# Patient Record
Sex: Male | Born: 1950 | Race: White | Hispanic: No | Marital: Married | Smoking: Current every day smoker
Health system: Southern US, Community
[De-identification: ages and names within clinical notes are randomized; demographics above are authoritative.]

## PROBLEM LIST (undated history)

## (undated) DIAGNOSIS — I10 Essential (primary) hypertension: Secondary | ICD-10-CM

## (undated) DIAGNOSIS — B019 Varicella without complication: Secondary | ICD-10-CM

## (undated) DIAGNOSIS — E7800 Pure hypercholesterolemia, unspecified: Secondary | ICD-10-CM

## (undated) DIAGNOSIS — K219 Gastro-esophageal reflux disease without esophagitis: Secondary | ICD-10-CM

## (undated) DIAGNOSIS — K5900 Constipation, unspecified: Secondary | ICD-10-CM

## (undated) HISTORY — DX: Gastro-esophageal reflux disease without esophagitis: K21.9

## (undated) HISTORY — PX: COLONOSCOPY WITH PROPOFOL: SHX5780

## (undated) HISTORY — PX: OTHER SURGICAL HISTORY: SHX169

## (undated) HISTORY — DX: Pure hypercholesterolemia, unspecified: E78.00

## (undated) HISTORY — DX: Varicella without complication: B01.9

## (undated) HISTORY — DX: Essential (primary) hypertension: I10

---

## 2011-04-08 ENCOUNTER — Encounter: Payer: Self-pay | Admitting: *Deleted

## 2011-04-08 ENCOUNTER — Ambulatory Visit (INDEPENDENT_AMBULATORY_CARE_PROVIDER_SITE_OTHER): Payer: BC Managed Care – PPO | Admitting: Cardiovascular Disease

## 2011-04-08 DIAGNOSIS — R079 Chest pain, unspecified: Secondary | ICD-10-CM | POA: Insufficient documentation

## 2011-04-08 DIAGNOSIS — E785 Hyperlipidemia, unspecified: Secondary | ICD-10-CM

## 2011-04-08 DIAGNOSIS — E1169 Type 2 diabetes mellitus with other specified complication: Secondary | ICD-10-CM | POA: Insufficient documentation

## 2011-04-08 DIAGNOSIS — E663 Overweight: Secondary | ICD-10-CM | POA: Insufficient documentation

## 2011-04-08 DIAGNOSIS — E119 Type 2 diabetes mellitus without complications: Secondary | ICD-10-CM

## 2011-04-08 DIAGNOSIS — E669 Obesity, unspecified: Secondary | ICD-10-CM

## 2011-04-08 DIAGNOSIS — I10 Essential (primary) hypertension: Secondary | ICD-10-CM

## 2011-04-08 NOTE — Assessment & Plan Note (Signed)
He does have some joint ache. We have suggested if symptoms get worse that he talk with Dr. Juanetta Gosling about an alternate cholesterol medication.

## 2011-04-08 NOTE — Assessment & Plan Note (Signed)
We have encouraged continued exercise, careful diet management in an effort to lose weight. 

## 2011-04-08 NOTE — Patient Instructions (Signed)
You are doing well. No medication changes were made. Monitor your heart rate at home  Please call us if you have new issues that need to be addressed before your next appt.

## 2011-04-08 NOTE — Progress Notes (Signed)
[name], male    DOB: [birth year], 61 y.o.   MRN: [id number]  HPI Comments: Jonathan Faulkner is a very pleasant 61 year old gentleman, patient of Dr. Juanetta Gosling, history of diabetes, hypertension, hyperlipidemia, obesity who presents with recent episodes of chest discomfort.  He reports that his symptoms are exacerbated by food such as spicy beans. He was started on omeprazole one month ago and his symptoms have significantly improved. He is able to belch frequently and his symptoms are relieved. He has been very active, managing a gun range. This morning he was sleeping for 30 minutes, moving wood he did not have any symptoms of chest pain. In the office, he had some tightness but was able to belch and relieve the discomfort. He reports he is much better than one month ago prior to the stomach medication. He was instructed recently to take omeprazole twice a day though he has not done this yet.   He is very active at baseline and does not feel he has any problems with his heart.  He gets up very early in the morning to do his chores and sometimes works until 6 PM. He denies any lightheadedness or dizziness or significant fatigue.  EKG shows sinus bradycardia rate 42 beats per minute with no significant ST or T wave changes   Outpatient Encounter Prescriptions as of 04/08/2011  Medication Sig Dispense Refill  . atenolol-chlorthalidone (TENORETIC) 50-25 MG per tablet Take 1 tablet by mouth daily.      Marland Kitchen atorvastatin (LIPITOR) 10 MG tablet Take 10 mg by mouth daily.      . metFORMIN (GLUCOPHAGE) 500 MG tablet Take 500 mg by mouth 2 (two) times daily with a meal.       . omeprazole (PRILOSEC) 20 MG capsule Take 20 mg by mouth daily.        Review of Systems  Constitutional: Negative.   HENT: Negative.   Eyes: Negative.   Respiratory: Negative.   Cardiovascular: Positive for chest pain.  Gastrointestinal: Negative.   Musculoskeletal: Negative.   Skin: Negative.   Neurological:  Negative.   Hematological: Negative.   Psychiatric/Behavioral: Negative.   All other systems reviewed and are negative.    BP 124/79  Pulse 42  Ht 6' (1.829 m)  Wt 236 lb 6.4 oz (107.23 kg)  BMI 32.06 kg/m2  Physical Exam  Nursing note and vitals reviewed. Constitutional: He is oriented to person, place, and time. He appears well-developed and well-nourished.       Obese  HENT:  Head: Normocephalic.  Nose: Nose normal.  Mouth/Throat: Oropharynx is clear and moist.  Eyes: Conjunctivae are normal. Pupils are equal, round, and reactive to light.  Neck: Normal range of motion. Neck supple. No JVD present.  Cardiovascular: Normal rate, regular rhythm, S1 normal, S2 normal, normal heart sounds and intact distal pulses.  Exam reveals no gallop and no friction rub.   No murmur heard. Pulmonary/Chest: Effort normal and breath sounds normal. No respiratory distress. He has no wheezes. He has no rales. He exhibits no tenderness.  Abdominal: Soft. Bowel sounds are normal. He exhibits no distension. There is no tenderness.  Musculoskeletal: Normal range of motion. He exhibits no edema and no tenderness.  Lymphadenopathy:    He has no cervical adenopathy.  Neurological: He is alert and oriented to person, place, and time. Coordination normal.  Skin: Skin is warm and dry. No rash noted. No erythema.  Psychiatric: He has a normal mood and affect. His behavior is  normal. Judgment and thought content normal.           Assessment and Plan

## 2011-04-08 NOTE — Assessment & Plan Note (Signed)
Blood pressure is well controlled on today's visit. No changes made to the medications. 

## 2011-04-08 NOTE — Assessment & Plan Note (Signed)
Atypical type chest pain symptoms, improved with proton pump inhibitor and belching. I would agree with taking omeprazole twice a day as was suggested to him by Dr. Juanetta Gosling. He is very active at baseline with no symptoms. I suggested we monitor him for now. If he begins to have symptoms with exertion, we would recommend routine treadmill testing.

## 2013-03-18 ENCOUNTER — Ambulatory Visit (INDEPENDENT_AMBULATORY_CARE_PROVIDER_SITE_OTHER): Payer: BC Managed Care – PPO

## 2013-03-18 ENCOUNTER — Ambulatory Visit (INDEPENDENT_AMBULATORY_CARE_PROVIDER_SITE_OTHER): Payer: BC Managed Care – PPO | Admitting: Podiatry

## 2013-03-18 ENCOUNTER — Encounter: Payer: Self-pay | Admitting: Podiatry

## 2013-03-18 VITALS — Ht 72.0 in | Wt 237.0 lb

## 2013-03-18 DIAGNOSIS — M722 Plantar fascial fibromatosis: Secondary | ICD-10-CM

## 2013-03-18 NOTE — Progress Notes (Signed)
   Subjective:    [name], male    DOB: 09-22-1950, 63 y.o.   MRN: [id number]  HPI Comments: My right heel , plantar heel right, feels like a nail at the center of the heel , it has been acting up for about 4-5 months now.   Foot Pain      Review of Systems  All other systems reviewed and are negative.       Objective:   Physical Exam: I have reviewed his past medical history medications allergies surgeries social history and review of systems. Pulses are strongly palpable bilateral neurologic sensorium is intact per since once the monofilament. Capillary fill time to digits one through 5 is immediate. Deep tendon reflexes are brisk and equal bilateral. Muscle strength is 5 over 5 dorsiflexors plantar flexors inverters everters all intrinsic musculature is intact. Orthopedic evaluation demonstrates rectus foot type bilateral. All joints distal to the ankle have a full range of motion without crepitation. Pain on palpation of the medial calcaneal tubercle of the right heel. No pain on medial and lateral compression of the calcaneus. Cutaneous evaluation demonstrates supple well hydrated cutis.        Assessment & Plan:  Assessment: Plantar fasciitis right  Plan: Discussed appropriate shoe gear stretching exercises ice therapy shoe gear modifications. He is currently wearing a pair diabetic shoes from [employer]. Because of his elevated blood sugars I will not give him a steroid by mouth but I did inject his right heel with Kenalog and local anesthetic I put him in a plantar fascial brace and a night splint. And I will followup with him in one month

## 2013-03-18 NOTE — Patient Instructions (Signed)
Plantar Fasciitis (Heel Spur Syndrome) with Rehab The plantar fascia is a fibrous, ligament-like, soft-tissue structure that spans the bottom of the foot. Plantar fasciitis is a condition that causes pain in the foot due to inflammation of the tissue. SYMPTOMS   Pain and tenderness on the underneath side of the foot.  Pain that worsens with standing or walking. CAUSES  Plantar fasciitis is caused by irritation and injury to the plantar fascia on the underneath side of the foot. Common mechanisms of injury include:  Direct trauma to bottom of the foot.  Damage to a small nerve that runs under the foot where the main fascia attaches to the heel bone.  Stress placed on the plantar fascia due to bone spurs. RISK INCREASES WITH:   Activities that place stress on the plantar fascia (running, jumping, pivoting, or cutting).  Poor strength and flexibility.  Improperly fitted shoes.  Tight calf muscles.  Flat feet.  Failure to warm-up properly before activity.  Obesity. PREVENTION  Warm up and stretch properly before activity.  Allow for adequate recovery between workouts.  Maintain physical fitness:  Strength, flexibility, and endurance.  Cardiovascular fitness.  Maintain a health body weight.  Avoid stress on the plantar fascia.  Wear properly fitted shoes, including arch supports for individuals who have flat feet. PROGNOSIS  If treated properly, then the symptoms of plantar fasciitis usually resolve without surgery. However, occasionally surgery is necessary. RELATED COMPLICATIONS   Recurrent symptoms that may result in a chronic condition.  Problems of the lower back that are caused by compensating for the injury, such as limping.  Pain or weakness of the foot during push-off following surgery.  Chronic inflammation, scarring, and partial or complete fascia tear, occurring more often from repeated injections. TREATMENT  Treatment initially involves the use of  ice and medication to help reduce pain and inflammation. The use of strengthening and stretching exercises may help reduce pain with activity, especially stretches of the Achilles tendon. These exercises may be performed at home or with a therapist. Your caregiver may recommend that you use heel cups of arch supports to help reduce stress on the plantar fascia. Occasionally, corticosteroid injections are given to reduce inflammation. If symptoms persist for greater than 6 months despite non-surgical (conservative), then surgery may be recommended.  MEDICATION   If pain medication is necessary, then nonsteroidal anti-inflammatory medications, such as aspirin and ibuprofen, or other minor pain relievers, such as acetaminophen, are often recommended.  Do not take pain medication within 7 days before surgery.  Prescription pain relievers may be given if deemed necessary by your caregiver. Use only as directed and only as much as you need.  Corticosteroid injections may be given by your caregiver. These injections should be reserved for the most serious cases, because they may only be given a certain number of times. HEAT AND COLD  Cold treatment (icing) relieves pain and reduces inflammation. Cold treatment should be applied for 10 to 15 minutes every 2 to 3 hours for inflammation and pain and immediately after any activity that aggravates your symptoms. Use ice packs or massage the area with a piece of ice (ice massage).  Heat treatment may be used prior to performing the stretching and strengthening activities prescribed by your caregiver, physical therapist, or athletic trainer. Use a heat pack or soak the injury in warm water. SEEK IMMEDIATE MEDICAL CARE IF:  Treatment seems to offer no benefit, or the condition worsens.  Any medications produce adverse side effects. EXERCISES RANGE   OF MOTION (ROM) AND STRETCHING EXERCISES - Plantar Fasciitis (Heel Spur Syndrome) These exercises may help you  when beginning to rehabilitate your injury. Your symptoms may resolve with or without further involvement from your physician, physical therapist or athletic trainer. While completing these exercises, remember:   Restoring tissue flexibility helps normal motion to return to the joints. This allows healthier, less painful movement and activity.  An effective stretch should be held for at least 30 seconds.  A stretch should never be painful. You should only feel a gentle lengthening or release in the stretched tissue. RANGE OF MOTION - Toe Extension, Flexion  Sit with your right / left leg crossed over your opposite knee.  Grasp your toes and gently pull them back toward the top of your foot. You should feel a stretch on the bottom of your toes and/or foot.  Hold this stretch for __________ seconds.  Now, gently pull your toes toward the bottom of your foot. You should feel a stretch on the top of your toes and or foot.  Hold this stretch for __________ seconds. Repeat __________ times. Complete this stretch __________ times per day.  RANGE OF MOTION - Ankle Dorsiflexion, Active Assisted  Remove shoes and sit on a chair that is preferably not on a carpeted surface.  Place right / left foot under knee. Extend your opposite leg for support.  Keeping your heel down, slide your right / left foot back toward the chair until you feel a stretch at your ankle or calf. If you do not feel a stretch, slide your bottom forward to the edge of the chair, while still keeping your heel down.  Hold this stretch for __________ seconds. Repeat __________ times. Complete this stretch __________ times per day.  STRETCH  Gastroc, Standing  Place hands on wall.  Extend right / left leg, keeping the front knee somewhat bent.  Slightly point your toes inward on your back foot.  Keeping your right / left heel on the floor and your knee straight, shift your weight toward the wall, not allowing your back to  arch.  You should feel a gentle stretch in the right / left calf. Hold this position for __________ seconds. Repeat __________ times. Complete this stretch __________ times per day. STRETCH  Soleus, Standing  Place hands on wall.  Extend right / left leg, keeping the other knee somewhat bent.  Slightly point your toes inward on your back foot.  Keep your right / left heel on the floor, bend your back knee, and slightly shift your weight over the back leg so that you feel a gentle stretch deep in your back calf.  Hold this position for __________ seconds. Repeat __________ times. Complete this stretch __________ times per day. STRETCH  Gastrocsoleus, Standing  Note: This exercise can place a lot of stress on your foot and ankle. Please complete this exercise only if specifically instructed by your caregiver.   Place the ball of your right / left foot on a step, keeping your other foot firmly on the same step.  Hold on to the wall or a rail for balance.  Slowly lift your other foot, allowing your body weight to press your heel down over the edge of the step.  You should feel a stretch in your right / left calf.  Hold this position for __________ seconds.  Repeat this exercise with a slight bend in your right / left knee. Repeat __________ times. Complete this stretch __________ times per day.    STRENGTHENING EXERCISES - Plantar Fasciitis (Heel Spur Syndrome)  These exercises may help you when beginning to rehabilitate your injury. They may resolve your symptoms with or without further involvement from your physician, physical therapist or athletic trainer. While completing these exercises, remember:   Muscles can gain both the endurance and the strength needed for everyday activities through controlled exercises.  Complete these exercises as instructed by your physician, physical therapist or athletic trainer. Progress the resistance and repetitions only as guided. STRENGTH - Towel  Curls  Sit in a chair positioned on a non-carpeted surface.  Place your foot on a towel, keeping your heel on the floor.  Pull the towel toward your heel by only curling your toes. Keep your heel on the floor.  If instructed by your physician, physical therapist or athletic trainer, add ____________________ at the end of the towel. Repeat __________ times. Complete this exercise __________ times per day. STRENGTH - Ankle Inversion  Secure one end of a rubber exercise band/tubing to a fixed object (table, pole). Loop the other end around your foot just before your toes.  Place your fists between your knees. This will focus your strengthening at your ankle.  Slowly, pull your big toe up and in, making sure the band/tubing is positioned to resist the entire motion.  Hold this position for __________ seconds.  Have your muscles resist the band/tubing as it slowly pulls your foot back to the starting position. Repeat __________ times. Complete this exercises __________ times per day.  Document Released: 01/21/2005 Document Revised: 04/15/2011 Document Reviewed: 05/05/2008 [employer] Patient Information 2014 [employer]. Plantar Fasciitis Plantar fasciitis is a common condition that causes foot pain. It is soreness (inflammation) of the band of tough fibrous tissue on the bottom of the foot that runs from the heel bone (calcaneus) to the ball of the foot. The cause of this soreness may be from excessive standing, poor fitting shoes, running on hard surfaces, being overweight, having an abnormal walk, or overuse (this is common in runners) of the painful foot or feet. It is also common in aerobic exercise dancers and ballet dancers. SYMPTOMS  Most people with plantar fasciitis complain of:  Severe pain in the morning on the bottom of their foot especially when taking the first steps out of bed. This pain recedes after a few minutes of walking.  Severe pain is experienced also during walking  following a long period of inactivity.  Pain is worse when walking barefoot or up stairs DIAGNOSIS   Your caregiver will diagnose this condition by examining and feeling your foot.  Special tests such as X-rays of your foot, are usually not needed. PREVENTION   Consult a sports medicine professional before beginning a new exercise program.  Walking programs offer a good workout. With walking there is a lower chance of overuse injuries common to runners. There is less impact and less jarring of the joints.  Begin all new exercise programs slowly. If problems or pain develop, decrease the amount of time or distance until you are at a comfortable level.  Wear good shoes and replace them regularly.  Stretch your foot and the heel cords at the back of the ankle (Achilles tendon) both before and after exercise.  Run or exercise on even surfaces that are not hard. For example, asphalt is better than pavement.  Do not run barefoot on hard surfaces.  If using a treadmill, vary the incline.  Do not continue to workout if you have foot or joint   problems. Seek professional help if they do not improve. HOME CARE INSTRUCTIONS   Avoid activities that cause you pain until you recover.  Use ice or cold packs on the problem or painful areas after working out.  Only take over-the-counter or prescription medicines for pain, discomfort, or fever as directed by your caregiver.  Soft shoe inserts or athletic shoes with air or gel sole cushions may be helpful.  If problems continue or become more severe, consult a sports medicine caregiver or your own health care provider. Cortisone is a potent anti-inflammatory medication that may be injected into the painful area. You can discuss this treatment with your caregiver. MAKE SURE YOU:   Understand these instructions.  Will watch your condition.  Will get help right away if you are not doing well or get worse. Document Released: 10/16/2000 Document  Revised: 04/15/2011 Document Reviewed: 12/16/2007 [employer] Patient Information 2014 [employer].  

## 2013-03-22 ENCOUNTER — Ambulatory Visit: Payer: Self-pay | Admitting: Podiatry

## 2013-04-14 ENCOUNTER — Ambulatory Visit: Payer: BC Managed Care – PPO | Admitting: Podiatry

## 2014-07-27 ENCOUNTER — Other Ambulatory Visit: Payer: Self-pay | Admitting: Family Medicine

## 2014-07-27 MED ORDER — OMEPRAZOLE 20 MG PO CPDR: 20.0000 mg | DELAYED_RELEASE_CAPSULE | Freq: Two times a day (BID) | ORAL | Status: DC

## 2014-08-15 ENCOUNTER — Other Ambulatory Visit: Payer: Self-pay | Admitting: Family Medicine

## 2014-08-15 ENCOUNTER — Telehealth: Payer: Self-pay | Admitting: Family Medicine

## 2014-08-15 MED ORDER — METFORMIN HCL 500 MG PO TABS: 500.0000 mg | ORAL_TABLET | Freq: Two times a day (BID) | ORAL | Status: DC

## 2014-08-15 NOTE — Telephone Encounter (Signed)
I have reordered this for this patient.-jh

## 2014-08-15 NOTE — Telephone Encounter (Signed)
According to [employer] he shopuld have been back in Feb to follow up. Do we write this? [facility]

## 2014-08-15 NOTE — Telephone Encounter (Signed)
Pt needs a refill on metformin sent to [place]

## 2014-11-25 ENCOUNTER — Other Ambulatory Visit: Payer: Self-pay | Admitting: Family Medicine

## 2014-11-25 MED ORDER — AMLODIPINE BESYLATE 2.5 MG PO TABS: 2.5000 mg | ORAL_TABLET | Freq: Every day | ORAL | Status: DC

## 2014-11-25 MED ORDER — LOSARTAN POTASSIUM 100 MG PO TABS: 100.0000 mg | ORAL_TABLET | Freq: Every day | ORAL | Status: DC

## 2014-11-25 MED ORDER — ATORVASTATIN CALCIUM 10 MG PO TABS: 10.0000 mg | ORAL_TABLET | Freq: Every day | ORAL | Status: DC

## 2014-11-25 MED ORDER — METFORMIN HCL 500 MG PO TABS: 500.0000 mg | ORAL_TABLET | Freq: Two times a day (BID) | ORAL | Status: DC

## 2014-12-02 ENCOUNTER — Ambulatory Visit (INDEPENDENT_AMBULATORY_CARE_PROVIDER_SITE_OTHER): Payer: BLUE CROSS/BLUE SHIELD | Admitting: Family Medicine

## 2014-12-02 ENCOUNTER — Encounter: Payer: Self-pay | Admitting: Family Medicine

## 2014-12-02 VITALS — BP 140/75 | HR 74 | Resp 16 | Ht 72.0 in | Wt 228.6 lb

## 2014-12-02 DIAGNOSIS — G4700 Insomnia, unspecified: Secondary | ICD-10-CM | POA: Diagnosis not present

## 2014-12-02 DIAGNOSIS — E119 Type 2 diabetes mellitus without complications: Secondary | ICD-10-CM

## 2014-12-02 DIAGNOSIS — I10 Essential (primary) hypertension: Secondary | ICD-10-CM | POA: Diagnosis not present

## 2014-12-02 DIAGNOSIS — Z23 Encounter for immunization: Secondary | ICD-10-CM

## 2014-12-02 LAB — POCT GLYCOSYLATED HEMOGLOBIN (HGB A1C): HEMOGLOBIN A1C: 8.3

## 2014-12-02 MED ORDER — SUVOREXANT 5 MG PO TABS: 5.0000 mg | ORAL_TABLET | Freq: Every evening | ORAL | Status: DC

## 2014-12-02 NOTE — Progress Notes (Signed)
Name: Jonathan Faulkner   MRN: [id number]    DOB: 1950/03/22   Date:12/02/2014       Progress Note  Subjective  Chief Complaint  Chief Complaint  Patient presents with  . Hypertension    HPI  Here for f/u of HBP and DM.  He does not take Metformin dose in evening.  He has left off Lipitor also as evening dose.  Takes other meds in AM.  Checks BS rarely.  BP can be labile with agitation. No problem-specific assessment & plan notes found for this encounter.   Past Medical History  Diagnosis Date  . Hypertension   . Hypercholesterolemia   . GERD (gastroesophageal reflux disease)   . Diabetes mellitus     Type II  . Varicella without mention of complication     hx  . Measles     hx    Social History  Substance Use Topics  . Smoking status: Current Every Day Smoker -- 20 years    Types: Cigars  . Smokeless tobacco: Never Used     Comment: 2-3 daily  . Alcohol Use: No     Current outpatient prescriptions:  .  amLODipine (NORVASC) 2.5 MG tablet, Take 1 tablet (2.5 mg total) by mouth daily., Disp: 90 tablet, Rfl: 3 .  atorvastatin (LIPITOR) 10 MG tablet, Take 1 tablet (10 mg total) by mouth daily at 6 PM., Disp: 90 tablet, Rfl: 3 .  losartan (COZAAR) 100 MG tablet, Take 1 tablet (100 mg total) by mouth daily., Disp: 90 tablet, Rfl: 3 .  metFORMIN (GLUCOPHAGE) 500 MG tablet, Take 1 tablet (500 mg total) by mouth 2 (two) times daily with a meal., Disp: 180 tablet, Rfl: 3 .  naproxen (NAPROSYN) 500 MG tablet, Take 500 mg by mouth 2 (two) times daily with a meal., Disp: , Rfl:  .  omeprazole (PRILOSEC) 20 MG capsule, Take 1 capsule (20 mg total) by mouth 2 (two) times daily before a meal., Disp: 60 capsule, Rfl: 12  Allergies  Allergen Reactions  . Iodine Hives    Review of Systems  Constitutional: Negative for fever, chills, weight loss and malaise/fatigue.  HENT: Negative for hearing loss.   Eyes: Negative for blurred vision and double vision.  Respiratory: Negative for  cough, sputum production, shortness of breath and wheezing.   Cardiovascular: Negative for chest pain, palpitations, orthopnea and leg swelling.  Gastrointestinal: Negative for heartburn, abdominal pain and blood in stool.  Genitourinary: Negative for dysuria, urgency and frequency.  Musculoskeletal: Negative for myalgias and joint pain.  Skin: Negative for rash.  Neurological: Negative for dizziness, tremors, weakness and headaches.      Objective  Filed Vitals:   12/02/14 1630  BP: 114/76  Pulse: 74  Resp: 16  Height: 6' (1.829 m)  Weight: 228 lb 9.6 oz (103.692 kg)     Physical Exam  Constitutional: He is oriented to person, place, and time and well-developed, well-nourished, and in no distress. No distress.  HENT:  Head: Normocephalic and atraumatic.  Neck: Normal range of motion. Neck supple. Carotid bruit is not present. No thyromegaly present.  Cardiovascular: Normal rate, regular rhythm, normal heart sounds and intact distal pulses.  Exam reveals no gallop and no friction rub.   No murmur heard. Pulmonary/Chest: Effort normal and breath sounds normal. No respiratory distress. He has no wheezes. He has no rales.  Abdominal: Soft. Bowel sounds are normal. He exhibits no distension and no mass. There is no tenderness.  Musculoskeletal: Normal  range of motion. He exhibits no edema.  Lymphadenopathy:    He has no cervical adenopathy.  Neurological: He is alert and oriented to person, place, and time.  Vitals reviewed.     Recent Results (from the past 2160 hour(s))  POCT HgB A1C     Status: Abnormal   Collection Time: 12/02/14  4:45 PM  Result Value Ref Range   Hemoglobin A1C 8.3      Assessment & Plan  1. Type 2 diabetes mellitus without complication, without long-term current use of insulin (HCC) - POCT HgB A1C-8.3 -cont. curent meds at appropriate doses. 2. Need for influenza vaccination  - Flu Vaccine QUAD 36+ mos PF IM (Fluarix & Fluzone Quad PF)  3.  Essential hypertension  -cont. Current meds. 4. Insomnia  - Suvorexant (BELSOMRA) 5 MG TABS; Take 5 mg by mouth Nightly. Use as needed for sleep  Dispense: 30 tablet; Refill: 5

## 2014-12-02 NOTE — Patient Instructions (Signed)
Take second dose of Metformin in evening as directed.

## 2015-02-27 ENCOUNTER — Other Ambulatory Visit: Payer: Self-pay | Admitting: Family Medicine

## 2015-03-07 ENCOUNTER — Ambulatory Visit (INDEPENDENT_AMBULATORY_CARE_PROVIDER_SITE_OTHER): Payer: BLUE CROSS/BLUE SHIELD | Admitting: Family Medicine

## 2015-03-07 ENCOUNTER — Encounter: Payer: Self-pay | Admitting: Family Medicine

## 2015-03-07 VITALS — BP 135/80 | HR 76 | Temp 98.5°F | Resp 16 | Ht 72.0 in | Wt 233.4 lb

## 2015-03-07 DIAGNOSIS — R079 Chest pain, unspecified: Secondary | ICD-10-CM

## 2015-03-07 DIAGNOSIS — I10 Essential (primary) hypertension: Secondary | ICD-10-CM

## 2015-03-07 DIAGNOSIS — E785 Hyperlipidemia, unspecified: Secondary | ICD-10-CM

## 2015-03-07 DIAGNOSIS — E119 Type 2 diabetes mellitus without complications: Secondary | ICD-10-CM | POA: Diagnosis not present

## 2015-03-07 LAB — POCT GLYCOSYLATED HEMOGLOBIN (HGB A1C): HEMOGLOBIN A1C: 9.3

## 2015-03-07 MED ORDER — GLIMEPIRIDE 4 MG PO TABS: 4.0000 mg | ORAL_TABLET | Freq: Every day | ORAL | Status: DC

## 2015-03-07 MED ORDER — SAXAGLIPTIN HCL 5 MG PO TABS: 5.0000 mg | ORAL_TABLET | Freq: Every day | ORAL | Status: DC

## 2015-03-07 NOTE — Progress Notes (Signed)
Name: Jonathan Faulkner   MRN: [id number]    DOB: [birth year]   Date:03/07/2015       Progress Note  Subjective  Chief Complaint  Chief Complaint  Patient presents with  . Diabetes    BS 120s    HPI Here for f/u of DM.  Also with GERD, HBP, elevated lipids.  He has not been checking sugars.  He has not been eating properly.  He can only tolerate Metformin 500 , 1 bid.  More gives him diarrhea.  Also he misses his second dose of metformin fairly often.  He takes his other medication.    He has been having more frequent indigestion and heartburn recently.  Supposed to be on 2 Omeprazole a day, but has only been taking one.  No problem-specific assessment & plan notes found for this encounter.   Past Medical History  Diagnosis Date  . Hypertension   . Hypercholesterolemia   . GERD (gastroesophageal reflux disease)   . Diabetes mellitus     Type II  . Varicella without mention of complication     hx  . Measles     hx    Past Surgical History  Procedure Laterality Date  . None      Family History  Problem Relation Age of Onset  . Adopted: Yes    Social History   Social History  . Marital Status: Married    Spouse Name: N/A  . Number of Children: N/A  . Years of Education: N/A   Occupational History  . Not on file.   Social History Main Topics  . Smoking status: Current Every Day Smoker -- 20 years    Types: Cigars  . Smokeless tobacco: Never Used     Comment: 2-3 daily  . Alcohol Use: No  . Drug Use: No  . Sexual Activity: Not on file   Other Topics Concern  . Not on file   Social History Narrative     Current outpatient prescriptions:  .  amLODipine (NORVASC) 2.5 MG tablet, Take 1 tablet (2.5 mg total) by mouth daily., Disp: 90 tablet, Rfl: 3 .  atorvastatin (LIPITOR) 10 MG tablet, Take 1 tablet (10 mg total) by mouth daily at 6 PM., Disp: 90 tablet, Rfl: 3 .  glimepiride (AMARYL) 4 MG tablet, Take 1 tablet (4 mg total) by mouth daily with  breakfast., Disp: 90 tablet, Rfl: 3 .  losartan (COZAAR) 100 MG tablet, Take 1 tablet (100 mg total) by mouth daily., Disp: 90 tablet, Rfl: 3 .  metFORMIN (GLUCOPHAGE) 500 MG tablet, Take 1 tablet (500 mg total) by mouth 2 (two) times daily with a meal., Disp: 180 tablet, Rfl: 3 .  naproxen (NAPROSYN) 500 MG tablet, Take 500 mg by mouth 2 (two) times daily with a meal., Disp: , Rfl:  .  omeprazole (PRILOSEC) 20 MG capsule, Take 1 capsule (20 mg total) by mouth 2 (two) times daily before a meal., Disp: 60 capsule, Rfl: 12 .  Suvorexant (BELSOMRA) 5 MG TABS, Take 5 mg by mouth Nightly. Use as needed for sleep, Disp: 30 tablet, Rfl: 5 .  saxagliptin HCl (ONGLYZA) 5 MG TABS tablet, Take 1 tablet (5 mg total) by mouth daily., Disp: 30 tablet, Rfl: 6  Allergies  Allergen Reactions  . Iodine Hives     Review of Systems  Constitutional: Negative for fever, chills, weight loss and malaise/fatigue.  HENT: Negative for hearing loss.   Eyes: Negative for blurred vision and double vision.  Respiratory: Negative for cough, shortness of breath and wheezing.   Cardiovascular: Negative for chest pain, palpitations and leg swelling.  Gastrointestinal: Positive for heartburn. Negative for abdominal pain and blood in stool.  Genitourinary: Negative for dysuria, urgency and frequency.  Skin: Negative for rash.  Neurological: Negative for dizziness, tremors, weakness and headaches.      Objective  Filed Vitals:   03/07/15 1603 03/07/15 1653  BP: 130/70 135/80  Pulse: 76   Temp: 98.5 F (36.9 C)   TempSrc: Oral   Resp: 16   Height: 6' (1.829 m)   Weight: 233 lb 6.4 oz (105.87 kg)     Physical Exam  Constitutional: He is oriented to person, place, and time and well-developed, well-nourished, and in no distress. No distress.  HENT:  Head: Normocephalic and atraumatic.  Eyes: Conjunctivae and EOM are normal. Pupils are equal, round, and reactive to light. No scleral icterus.  Neck: Normal range  of motion. Neck supple. Carotid bruit is not present. No thyromegaly present.  Cardiovascular: Normal rate, regular rhythm and normal heart sounds.  Exam reveals no gallop and no friction rub.   No murmur heard. Pulmonary/Chest: Effort normal and breath sounds normal. No respiratory distress. He has no wheezes. He has no rales.  Abdominal: Soft. Bowel sounds are normal. He exhibits no distension and no mass. There is no tenderness.  Musculoskeletal: He exhibits no edema.  Lymphadenopathy:    He has no cervical adenopathy.  Neurological: He is alert and oriented to person, place, and time.  Vitals reviewed.      Recent Results (from the past 2160 hour(s))  POCT HgB A1C     Status: Abnormal   Collection Time: 03/07/15  4:12 PM  Result Value Ref Range   Hemoglobin A1C 9.3      Assessment & Plan  Problem List Items Addressed This Visit      Cardiovascular and Mediastinum   HTN (hypertension)     Endocrine   Type 2 diabetes mellitus ([place]) - Primary   Relevant Medications   glimepiride (AMARYL) 4 MG tablet   saxagliptin HCl (ONGLYZA) 5 MG TABS tablet   Other Relevant Orders   POCT HgB A1C (Completed)     Other   Hyperlipidemia   Chest pain   Relevant Orders   Ambulatory referral to Cardiology      Meds ordered this encounter  Medications  . glimepiride (AMARYL) 4 MG tablet    Sig: Take 1 tablet (4 mg total) by mouth daily with breakfast.    Dispense:  90 tablet    Refill:  3  . saxagliptin HCl (ONGLYZA) 5 MG TABS tablet    Sig: Take 1 tablet (5 mg total) by mouth daily.    Dispense:  30 tablet    Refill:  6   1. Type 2 diabetes mellitus without complication, unspecified long term insulin use status (HCC)  - POCT HgB A1C-9.3 - glimepiride (AMARYL) 4 MG tablet; Take 1 tablet (4 mg total) by mouth daily with breakfast.  Dispense: 90 tablet; Refill: 3 - saxagliptin HCl (ONGLYZA) 5 MG TABS tablet; Take 1 tablet (5 mg total) by mouth daily.  Dispense: 30 tablet;  Refill: 6 Cont  Metformin 2. Essential hypertension  Cont. med 3. Hyperlipidemia  Cont. med 4. Chest pain, unspecified chest pain type  - Ambulatory referral to Cardiology

## 2015-03-16 ENCOUNTER — Encounter (INDEPENDENT_AMBULATORY_CARE_PROVIDER_SITE_OTHER): Payer: Self-pay

## 2015-03-16 ENCOUNTER — Encounter: Payer: Self-pay | Admitting: Physician Assistant

## 2015-03-16 ENCOUNTER — Ambulatory Visit (INDEPENDENT_AMBULATORY_CARE_PROVIDER_SITE_OTHER): Payer: BLUE CROSS/BLUE SHIELD | Admitting: Physician Assistant

## 2015-03-16 VITALS — BP 110/70 | HR 62 | Ht 72.0 in | Wt 231.5 lb

## 2015-03-16 DIAGNOSIS — I10 Essential (primary) hypertension: Secondary | ICD-10-CM

## 2015-03-16 DIAGNOSIS — E785 Hyperlipidemia, unspecified: Secondary | ICD-10-CM

## 2015-03-16 DIAGNOSIS — K219 Gastro-esophageal reflux disease without esophagitis: Secondary | ICD-10-CM

## 2015-03-16 DIAGNOSIS — Z720 Tobacco use: Secondary | ICD-10-CM

## 2015-03-16 DIAGNOSIS — E669 Obesity, unspecified: Secondary | ICD-10-CM

## 2015-03-16 DIAGNOSIS — I4510 Unspecified right bundle-branch block: Secondary | ICD-10-CM

## 2015-03-16 DIAGNOSIS — R079 Chest pain, unspecified: Secondary | ICD-10-CM

## 2015-03-16 NOTE — Progress Notes (Signed)
Cardiology Office Note Date:  03/16/2015  Patient ID:  Jonathan Faulkner, Jonathan Faulkner Nov 05, 1950, MRN [id number] PCP:  Dicky Doe, MD  Cardiologist:  Dr. Rockey Situ, MD    Chief Complaint: Chest pain and reflux  History of Present Illness: Jonathan Faulkner is a 65 y.o. male with history of DM, HTN, HLD, obesity who was previously seen by Dr. Rockey Situ in 2013 for evaluation of chest discomfort returns to clinic for evaluation of the same.   Back in 2013 he was seen for chest discomfort that was exacerbated by spicy foods which improved with omperazole and belching. No exertional symptoms at that time. He was very active at baseline. ECG in the office was non-acute and monitoring was advised given his atypical symptoms.   He returns to clinic today with more indigestion and reflux lately. Has not been eating as he should. Has also not been taking his omeprazole as directed, only once daily. He has not had any exertional symptoms. He has a fairly physically demanding job and has never had any chest pain associated with this. When he burps his symptoms get better. Pain is associated with food consumption. Pain is epigastric and does not radiate. No associated diaphoresis, SOB, nausea, vomiting, palpitations, presyncope, or syncope. At his PCP's office his most recent A1C from 03/07/15 was noted to be 9.3%, previously 8.3% in October 2016. He is currently chest pain free and has been working fairly hard outside all morning without any symptoms.     Past Medical History  Diagnosis Date  . Hypertension   . Hypercholesterolemia   . GERD (gastroesophageal reflux disease)   . Diabetes mellitus     Type II  . Varicella without mention of complication     hx  . Measles     hx    Past Surgical History  Procedure Laterality Date  . None      Current Outpatient Prescriptions  Medication Sig Dispense Refill  . amLODipine (NORVASC) 2.5 MG tablet Take 1 tablet (2.5 mg total) by mouth daily. 90 tablet 3    . atorvastatin (LIPITOR) 10 MG tablet Take 1 tablet (10 mg total) by mouth daily at 6 PM. 90 tablet 3  . glimepiride (AMARYL) 4 MG tablet Take 1 tablet (4 mg total) by mouth daily with breakfast. 90 tablet 3  . losartan (COZAAR) 100 MG tablet Take 1 tablet (100 mg total) by mouth daily. 90 tablet 3  . metFORMIN (GLUCOPHAGE) 500 MG tablet Take 1 tablet (500 mg total) by mouth 2 (two) times daily with a meal. 180 tablet 3  . naproxen (NAPROSYN) 500 MG tablet Take 500 mg by mouth 2 (two) times daily with a meal.    . omeprazole (PRILOSEC) 20 MG capsule Take 1 capsule (20 mg total) by mouth 2 (two) times daily before a meal. 60 capsule 12  . saxagliptin HCl (ONGLYZA) 5 MG TABS tablet Take 1 tablet (5 mg total) by mouth daily. 30 tablet 6  . Suvorexant (BELSOMRA) 5 MG TABS Take 5 mg by mouth Nightly. Use as needed for sleep 30 tablet 5   No current facility-administered medications for this visit.    Allergies:   Iodine   Social History:  The patient  reports that he has been smoking Cigars.  He has never used smokeless tobacco. He reports that he does not drink alcohol or use illicit drugs.   Family History:  The patient's He was adopted. Family history is unknown by patient.  ROS:  Review of Systems  Constitutional: Positive for malaise/fatigue. Negative for fever, chills, weight loss and diaphoresis.  HENT: Negative for congestion.   Eyes: Negative for discharge and redness.  Respiratory: Negative for cough, hemoptysis, sputum production, shortness of breath and wheezing.   Cardiovascular: Positive for chest pain. Negative for palpitations, orthopnea, claudication, leg swelling and PND.  Gastrointestinal: Positive for heartburn. Negative for nausea, vomiting and abdominal pain.  Musculoskeletal: Negative for myalgias, back pain and falls.  Skin: Negative for rash.  Neurological: Negative for dizziness, tingling, tremors, sensory change, speech change, focal weakness, seizures, loss of  consciousness and weakness.  Endo/Heme/Allergies: Does not bruise/bleed easily.  Psychiatric/Behavioral: Positive for substance abuse. The patient is not nervous/anxious.        Ongoing tobacco abuse  All other systems reviewed and are negative.     PHYSICAL EXAM:  VS:  BP 110/70 mmHg  Pulse 62  Ht 6' (1.829 m)  Wt 231 lb 8 oz (105.008 kg)  BMI 31.39 kg/m2 BMI: Body mass index is 31.39 kg/(m^2). Well nourished, well developed, in no acute distress HEENT: normocephalic, atraumatic Neck: no JVD, carotid bruits or masses Cardiac:  normal S1, S2; RRR; no murmurs, rubs, or gallops Lungs:  clear to auscultation bilaterally, no wheezing, rhonchi or rales Abd: obese, soft, nontender, no hepatomegaly, + BS MS: no deformity or atrophy Ext: no edema Skin: warm and dry, no rash Neuro:  moves all extremities spontaneously, no focal abnormalities noted, follows commands Psych: euthymic mood, full affect   EKG:  Was ordered today. Shows NSR, 62 bpm, RBBB (new)  Recent Labs: No results found for requested labs within last 365 days.  No results found for requested labs within last 365 days.   CrCl cannot be calculated (Patient has no serum creatinine result on file.).   Wt Readings from Last 3 Encounters:  03/16/15 231 lb 8 oz (105.008 kg)  03/07/15 233 lb 6.4 oz (105.87 kg)  12/02/14 228 lb 9.6 oz (103.692 kg)     Other studies reviewed: Additional studies/records reviewed today include: summarized above  ASSESSMENT AND PLAN:  1. Atypical chest pain: -No exertional component  -Symptoms worse with decreased PPI and food consumption  -Schedule Lexiscan Myoview to evaluate for high risk ischemia   2. New right bundle branch block: -Asymptomatic  -Stress test as above to evaluate for myocardial ischemia  -No history or risk factors for PE, vital signs stable, no signs of arrhythmia  -Check echocardiogram to evaluate for structural heart disease, LV systolic function, and RV  size  3. Ongoing tobacco abuse: -Cessation advised  4. Uncontrolled DM: -He ate poorly over the Christmas holiday. He reports blood sugars still in the 120's fasting in the morning -Per PCP  5. HTN: -Well controlled -Continue current medications per PCP  6. History of HLD: -On Lipitor 10 mg daily -No lipids on file, check lipid and liver for risk stratification   7. Obesity: -Weight loss advised   8.         Reflux: -PPI per PCP  Disposition: F/u with Dr. Rockey Situ, MD s/p stress test  Current medicines are reviewed at length with the patient today.  The patient did not have any concerns regarding medicines.  Melvern Banker PA-C 03/16/2015 1:34 PM     [address] [place], [place] [address] [phone]

## 2015-03-16 NOTE — Patient Instructions (Addendum)
Medication Instructions:  Your physician recommends that you continue on your current medications as directed. Please refer to the Current Medication list given to you today.   Labwork: CMET, CBC, lipid  Testing/Procedures: Your physician has requested that you have a lexiscan myoview. For further information please visit [email or web address]. Please follow instruction sheet, as given.  [place]  Your caregiver has ordered a Stress Test with nuclear imaging. The purpose of this test is to evaluate the blood supply to your heart muscle. This procedure is referred to as a "Non-Invasive Stress Test." This is because other than having an IV started in your vein, nothing is inserted or "invades" your body. Cardiac stress tests are done to find areas of poor blood flow to the heart by determining the extent of coronary artery disease (CAD). Some patients exercise on a treadmill, which naturally increases the blood flow to your heart, while others who are  unable to walk on a treadmill due to physical limitations have a pharmacologic/chemical stress agent called Lexiscan . This medicine will mimic walking on a treadmill by temporarily increasing your coronary blood flow.   Please note: these test may take anywhere between 2-4 hours to complete  PLEASE REPORT TO [facility] AT THE FIRST DESK WILL DIRECT YOU WHERE TO GO  Date of Procedure:_____Thursday, Feb 16_______________________  Arrival Time for Procedure:_______7:15am__________________  Instructions regarding medication:   __xx__ : Hold diabetes medication morning of procedure    PLEASE NOTIFY THE OFFICE AT LEAST 24 HOURS IN ADVANCE IF YOU ARE UNABLE TO KEEP YOUR APPOINTMENT.  [phone] AND  PLEASE NOTIFY NUCLEAR MEDICINE AT [facility] AT LEAST 24 HOURS IN ADVANCE IF YOU ARE UNABLE TO KEEP YOUR APPOINTMENT. [phone]  How to prepare for your Myoview test:   Do not eat or drink after midnight  No  caffeine for 24 hours prior to test  No smoking 24 hours prior to test.  Your medication may be taken with water.  If your doctor stopped a medication because of this test, do not take that medication.  Ladies, please do not wear dresses.  Skirts or pants are appropriate. Please wear a short sleeve shirt.  No perfume, cologne or lotion.  Wear comfortable walking shoes. No heels!            Follow-Up: Your physician recommends that you schedule a follow-up appointment in: 3 weeks with Dr. Rockey Situ.    Any Other Special Instructions Will Be Listed Below (If Applicable).     If you need a refill on your cardiac medications before your next appointment, please call your pharmacy.  Cardiac Nuclear Scanning A cardiac nuclear scan is used to check your heart for problems, such as the following:  A portion of the heart is not getting enough blood.  Part of the heart muscle has died, which happens with a heart attack.  The heart wall is not working normally.  In this test, a radioactive dye (tracer) is injected into your bloodstream. After the tracer has traveled to your heart, a scanning device is used to measure how much of the tracer is absorbed by or distributed to various areas of your heart. LET [facility] CARE PROVIDER KNOW ABOUT:  Any allergies you have.  All medicines you are taking, including vitamins, herbs, eye drops, creams, and over-the-counter medicines.  Previous problems you or members of your family have had with the use of anesthetics.  Any blood disorders you have.  Previous surgeries you have  had.  Medical conditions you have.  RISKS AND COMPLICATIONS Generally, this is a safe procedure. However, as with any procedure, problems can occur. Possible problems include:   Serious chest pain.  Rapid heartbeat.  Sensation of warmth in your chest. This usually passes quickly. BEFORE THE PROCEDURE Ask your health care provider about changing or  stopping your regular medicines. PROCEDURE This procedure is usually done at a hospital and takes 2-4 hours.  An IV tube is inserted into one of your veins.  Your health care provider will inject a small amount of radioactive tracer through the tube.  You will then wait for 20-40 minutes while the tracer travels through your bloodstream.  You will lie down on an exam table so images of your heart can be taken. Images will be taken for about 15-20 minutes.  You will exercise on a treadmill or stationary bike. While you exercise, your heart activity will be monitored with an electrocardiogram (ECG), and your blood pressure will be checked.  If you are unable to exercise, you may be given a medicine to make your heart beat faster.  When blood flow to your heart has peaked, tracer will again be injected through the IV tube.  After 20-40 minutes, you will get back on the exam table and have more images taken of your heart.  When the procedure is over, your IV tube will be removed. AFTER THE PROCEDURE  You will likely be able to leave shortly after the test. Unless your health care provider tells you otherwise, you may return to your normal schedule, including diet, activities, and medicines.  Make sure you find out how and when you will get your test results.   This information is not intended to replace advice given to you by your health care provider. Make sure you discuss any questions you have with your health care provider.   Document Released: 02/16/2004 Document Revised: 01/26/2013 Document Reviewed: 12/30/2012 Elsevier Interactive Patient Education [employer].

## 2015-03-17 LAB — LIPID PANEL
CHOL/HDL RATIO: 4.4 ratio (ref 0.0–5.0)
Cholesterol, Total: 132 mg/dL (ref 100–199)
HDL: 30 mg/dL — AB (ref >39)
LDL CALC: 83 mg/dL (ref 0–99)
TRIGLYCERIDES: 95 mg/dL (ref 0–149)
VLDL CHOLESTEROL CAL: 19 mg/dL (ref 5–40)

## 2015-03-17 LAB — COMPREHENSIVE METABOLIC PANEL
ALT: 43 IU/L (ref 0–44)
AST: 21 IU/L (ref 0–40)
Albumin/Globulin Ratio: 1.8 (ref 1.1–2.5)
Albumin: 4.4 g/dL (ref 3.6–4.8)
Alkaline Phosphatase: 71 IU/L (ref 39–117)
BILIRUBIN TOTAL: 0.3 mg/dL (ref 0.0–1.2)
BUN/Creatinine Ratio: 19 (ref 10–22)
BUN: 13 mg/dL (ref 8–27)
CALCIUM: 9.6 mg/dL (ref 8.6–10.2)
CHLORIDE: 101 mmol/L (ref 96–106)
CO2: 22 mmol/L (ref 18–29)
Creatinine, Ser: 0.68 mg/dL — ABNORMAL LOW (ref 0.76–1.27)
GFR calc non Af Amer: 102 mL/min/{1.73_m2} (ref >59)
GFR, EST AFRICAN AMERICAN: 118 mL/min/{1.73_m2} (ref >59)
GLUCOSE: 205 mg/dL — AB (ref 65–99)
Globulin, Total: 2.5 g/dL (ref 1.5–4.5)
Potassium: 4.7 mmol/L (ref 3.5–5.2)
Sodium: 139 mmol/L (ref 134–144)
TOTAL PROTEIN: 6.9 g/dL (ref 6.0–8.5)

## 2015-03-17 LAB — CBC
HEMATOCRIT: 48.4 % (ref 37.5–51.0)
HEMOGLOBIN: 17 g/dL (ref 12.6–17.7)
MCH: 31.7 pg (ref 26.6–33.0)
MCHC: 35.1 g/dL (ref 31.5–35.7)
MCV: 90 fL (ref 79–97)
Platelets: 230 10*3/uL (ref 150–379)
RBC: 5.36 x10E6/uL (ref 4.14–5.80)
RDW: 14.4 % (ref 12.3–15.4)
WBC: 6.9 10*3/uL (ref 3.4–10.8)

## 2015-03-23 ENCOUNTER — Encounter
Admission: RE | Admit: 2015-03-23 | Discharge: 2015-03-23 | Disposition: A | Discharge status: Home / Self Care | Payer: BLUE CROSS/BLUE SHIELD | Source: Ambulatory Visit | Attending: Physician Assistant | Admitting: Physician Assistant

## 2015-03-23 DIAGNOSIS — R079 Chest pain, unspecified: Secondary | ICD-10-CM | POA: Diagnosis not present

## 2015-03-23 LAB — NM MYOCAR MULTI W/SPECT W/WALL MOTION / EF
CHL CUP MPHR: 158 {beats}/min
CHL CUP NUCLEAR SRS: 0
CHL CUP RESTING HR STRESS: 64 {beats}/min
CHL CUP STRESS STAGE 1 HR: 61 {beats}/min
CHL CUP STRESS STAGE 3 GRADE: 0 %
CHL CUP STRESS STAGE 3 SPEED: 0 mph
CHL CUP STRESS STAGE 4 GRADE: 0 %
CHL CUP STRESS STAGE 4 HR: 93 {beats}/min
CHL CUP STRESS STAGE 4 SPEED: 0 mph
CHL CUP STRESS STAGE 5 DBP: 144 mmHg
CHL CUP STRESS STAGE 5 GRADE: 0 %
CHL CUP STRESS STAGE 6 DBP: 73 mmHg
CHL CUP STRESS STAGE 6 SPEED: 0 mph
CSEPED: 0 min
CSEPEDS: 0 s
CSEPEW: 1 METS
CSEPHR: 58 %
LVDIAVOL: 114 mL
LVSYSVOL: 50 mL
NUC STRESS TID: 1.06
Peak HR: 93 {beats}/min
Percent of predicted max HR: 58 %
SDS: 0
SSS: 0
Stage 2 Grade: 0 %
Stage 2 HR: 61 {beats}/min
Stage 2 Speed: 0 mph
Stage 3 HR: 61 {beats}/min
Stage 5 HR: 89 {beats}/min
Stage 5 SBP: 157 mmHg
Stage 5 Speed: 0 mph
Stage 6 Grade: 0 %
Stage 6 HR: 72 {beats}/min
Stage 6 SBP: 143 mmHg

## 2015-03-23 MED ORDER — TECHNETIUM TC 99M SESTAMIBI - CARDIOLITE
13.8500 | Freq: Once | INTRAVENOUS | Status: AC | PRN
Start: 2015-03-23 — End: 2015-03-23
  Administered 2015-03-23: 08:00:00 13.85 via INTRAVENOUS

## 2015-03-23 MED ORDER — REGADENOSON 0.4 MG/5ML IV SOLN
0.4000 mg | Freq: Once | INTRAVENOUS | Status: AC
  Administered 2015-03-23: 0.4 mg via INTRAVENOUS
  Filled 2015-03-23: qty 5

## 2015-03-23 MED ORDER — TECHNETIUM TC 99M SESTAMIBI - CARDIOLITE
32.5990 | Freq: Once | INTRAVENOUS | Status: AC | PRN
  Administered 2015-03-23: 09:00:00 32.599 via INTRAVENOUS

## 2015-03-24 ENCOUNTER — Other Ambulatory Visit: Payer: Self-pay

## 2015-03-24 DIAGNOSIS — I159 Secondary hypertension, unspecified: Secondary | ICD-10-CM

## 2015-04-05 ENCOUNTER — Other Ambulatory Visit: Payer: Self-pay | Admitting: Physician Assistant

## 2015-04-05 DIAGNOSIS — I159 Secondary hypertension, unspecified: Secondary | ICD-10-CM

## 2015-04-05 DIAGNOSIS — R079 Chest pain, unspecified: Secondary | ICD-10-CM

## 2015-04-06 ENCOUNTER — Other Ambulatory Visit: Payer: Self-pay

## 2015-04-06 ENCOUNTER — Ambulatory Visit (INDEPENDENT_AMBULATORY_CARE_PROVIDER_SITE_OTHER): Payer: BLUE CROSS/BLUE SHIELD

## 2015-04-06 DIAGNOSIS — I159 Secondary hypertension, unspecified: Secondary | ICD-10-CM | POA: Diagnosis not present

## 2015-04-06 DIAGNOSIS — R079 Chest pain, unspecified: Secondary | ICD-10-CM

## 2015-04-21 ENCOUNTER — Ambulatory Visit: Payer: BLUE CROSS/BLUE SHIELD | Admitting: Cardiovascular Disease

## 2015-05-01 ENCOUNTER — Encounter: Payer: Self-pay | Admitting: Family Medicine

## 2015-05-01 ENCOUNTER — Ambulatory Visit (INDEPENDENT_AMBULATORY_CARE_PROVIDER_SITE_OTHER): Payer: BLUE CROSS/BLUE SHIELD | Admitting: Family Medicine

## 2015-05-01 VITALS — BP 133/66 | HR 77 | Temp 98.4°F | Resp 16 | Ht 72.0 in | Wt 230.0 lb

## 2015-05-01 DIAGNOSIS — R509 Fever, unspecified: Secondary | ICD-10-CM

## 2015-05-01 DIAGNOSIS — R05 Cough: Secondary | ICD-10-CM | POA: Diagnosis not present

## 2015-05-01 DIAGNOSIS — R059 Cough, unspecified: Secondary | ICD-10-CM

## 2015-05-01 DIAGNOSIS — J101 Influenza due to other identified influenza virus with other respiratory manifestations: Secondary | ICD-10-CM

## 2015-05-01 LAB — POCT INFLUENZA A/B
INFLUENZA A, POC: POSITIVE — AB
INFLUENZA B, POC: NEGATIVE

## 2015-05-01 MED ORDER — OSELTAMIVIR PHOSPHATE 75 MG PO CAPS: 75.0000 mg | ORAL_CAPSULE | Freq: Two times a day (BID) | ORAL | Status: AC

## 2015-05-01 MED ORDER — AZITHROMYCIN 250 MG PO TABS: ORAL_TABLET | ORAL | Status: DC

## 2015-05-01 NOTE — Progress Notes (Signed)
Name: Jonathan Faulkner   MRN: [id number]    DOB: 09-Oct-1950   Date:05/01/2015       Progress Note  Subjective  Chief Complaint  Chief Complaint  Patient presents with  . Cough    HPI 2 days of sudden onset of chills, aches, fever.  Has some congestion and a cough with some production and congestion.  Cough productive of light green sputum.  No problem-specific assessment & plan notes found for this encounter.   Past Medical History  Diagnosis Date  . Hypertension   . Hypercholesterolemia   . GERD (gastroesophageal reflux disease)   . Diabetes mellitus     Type II  . Varicella without mention of complication     hx  . Measles     hx    Social History  Substance Use Topics  . Smoking status: Current Every Day Smoker -- 47 years    Types: Cigars  . Smokeless tobacco: Never Used     Comment: 2-3 daily  . Alcohol Use: No     Current outpatient prescriptions:  .  amLODipine (NORVASC) 2.5 MG tablet, Take 1 tablet (2.5 mg total) by mouth daily., Disp: 90 tablet, Rfl: 3 .  atorvastatin (LIPITOR) 10 MG tablet, Take 1 tablet (10 mg total) by mouth daily at 6 PM., Disp: 90 tablet, Rfl: 3 .  glimepiride (AMARYL) 4 MG tablet, Take 1 tablet (4 mg total) by mouth daily with breakfast., Disp: 90 tablet, Rfl: 3 .  losartan (COZAAR) 100 MG tablet, Take 1 tablet (100 mg total) by mouth daily., Disp: 90 tablet, Rfl: 3 .  metFORMIN (GLUCOPHAGE) 500 MG tablet, Take 1 tablet (500 mg total) by mouth 2 (two) times daily with a meal., Disp: 180 tablet, Rfl: 3 .  naproxen (NAPROSYN) 500 MG tablet, Take 500 mg by mouth 2 (two) times daily with a meal., Disp: , Rfl:  .  omeprazole (PRILOSEC) 20 MG capsule, Take 1 capsule (20 mg total) by mouth 2 (two) times daily before a meal., Disp: 60 capsule, Rfl: 12 .  saxagliptin HCl (ONGLYZA) 5 MG TABS tablet, Take 1 tablet (5 mg total) by mouth daily., Disp: 30 tablet, Rfl: 6 .  Suvorexant (BELSOMRA) 5 MG TABS, Take 5 mg by mouth Nightly. Use as needed for  sleep, Disp: 30 tablet, Rfl: 5  Allergies  Allergen Reactions  . Iodine Hives    Review of Systems  Constitutional: Positive for fever, chills and malaise/fatigue. Negative for weight loss.  HENT: Positive for congestion.   Respiratory: Positive for cough. Negative for shortness of breath and wheezing.   Cardiovascular: Negative for chest pain, palpitations and leg swelling.  Gastrointestinal: Negative for heartburn, abdominal pain and blood in stool.  Genitourinary: Negative for dysuria, urgency and frequency.  Skin: Negative for rash.  Neurological: Positive for weakness and headaches.      Objective  Filed Vitals:   05/01/15 1115  BP: 133/66  Pulse: 77  Temp: 98.4 F (36.9 C)  Resp: 16  Height: 6' (1.829 m)  Weight: 230 lb (104.327 kg)  SpO2: 98%     Physical Exam  Constitutional: He is oriented to person, place, and time and well-developed, well-nourished, and in no distress. No distress.  HENT:  Head: Normocephalic and atraumatic.  Nose: Rhinorrhea (clear) present.  Mouth/Throat: Posterior oropharyngeal erythema (minor) present.  Eyes: Conjunctivae and EOM are normal. Pupils are equal, round, and reactive to light.  Cardiovascular: Normal rate, regular rhythm and normal heart sounds.  Exam reveals  no gallop and no friction rub.   Pulmonary/Chest: Effort normal and breath sounds normal. No respiratory distress. He has no wheezes. He has no rales.  Lymphadenopathy:    He has no cervical adenopathy.  Neurological: He is alert and oriented to person, place, and time.  Skin: Skin is warm and dry.  Vitals reviewed.     Recent Results (from the past 2160 hour(s))  POCT HgB A1C     Status: Abnormal   Collection Time: 03/07/15  4:12 PM  Result Value Ref Range   Hemoglobin A1C 9.3   Comp Met (CMET)     Status: Abnormal   Collection Time: 03/16/15  2:12 PM  Result Value Ref Range   Glucose 205 (H) 65 - 99 mg/dL   BUN 13 8 - 27 mg/dL   Creatinine, Ser 0.68 (L)  0.76 - 1.27 mg/dL   GFR calc non Af Amer 102 >59 mL/min/1.73   GFR calc Af Amer 118 >59 mL/min/1.73   BUN/Creatinine Ratio 19 10 - 22   Sodium 139 134 - 144 mmol/L   Potassium 4.7 3.5 - 5.2 mmol/L   Chloride 101 96 - 106 mmol/L   CO2 22 18 - 29 mmol/L   Calcium 9.6 8.6 - 10.2 mg/dL   Total Protein 6.9 6.0 - 8.5 g/dL   Albumin 4.4 3.6 - 4.8 g/dL   Globulin, Total 2.5 1.5 - 4.5 g/dL   Albumin/Globulin Ratio 1.8 1.1 - 2.5   Bilirubin Total 0.3 0.0 - 1.2 mg/dL   Alkaline Phosphatase 71 39 - 117 IU/L   AST 21 0 - 40 IU/L   ALT 43 0 - 44 IU/L  CBC     Status: None   Collection Time: 03/16/15  2:12 PM  Result Value Ref Range   WBC 6.9 3.4 - 10.8 x10E3/uL   RBC 5.36 4.14 - 5.80 x10E6/uL   Hemoglobin 17.0 12.6 - 17.7 g/dL   Hematocrit 48.4 37.5 - 51.0 %   MCV 90 79 - 97 fL   MCH 31.7 26.6 - 33.0 pg   MCHC 35.1 31.5 - 35.7 g/dL   RDW 14.4 12.3 - 15.4 %   Platelets 230 150 - 379 x10E3/uL  Lipid Profile     Status: Abnormal   Collection Time: 03/16/15  2:12 PM  Result Value Ref Range   Cholesterol, Total 132 100 - 199 mg/dL   Triglycerides 95 0 - 149 mg/dL   HDL 30 (L) >39 mg/dL   VLDL Cholesterol Cal 19 5 - 40 mg/dL   LDL Calculated 83 0 - 99 mg/dL   Chol/HDL Ratio 4.4 0.0 - 5.0 ratio units    Comment:                                   T. Chol/HDL Ratio                                             Men  Women                               1/2 Avg.Risk  3.4    3.3  Avg.Risk  5.0    4.4                                2X Avg.Risk  9.6    7.1                                3X Avg.Risk 23.4   11.0   NM Myocar Multi W/Spect W/Wall Motion / EF     Status: None   Collection Time: 03/23/15  9:40 AM  Result Value Ref Range   Rest HR 64 bpm   Rest BP 134/73 mmHg   Exercise duration (min) 0 min   Exercise duration (sec) 0 sec   Estimated workload 1.0 METS   Peak HR 93 BPM   Peak BP  mmHg   MPHR 158 bpm   Percent HR 58 %   RPE     LV sys vol 50 mL    TID 1.06    LV dias vol 114 mL   LHR     SSS 0    SRS 0    SDS 0    Phase 1 name PREINFSN    Stage 1 Name SUPINE    Stage 1 Time 00:00:02    Stage 1 HR 61 bpm   Phase 2 Name [place]    Stage 2 Name HYPERV.    Stage 2 Time 00:01:06    Stage 2 Speed 0.0 mph   Stage 2 Grade 0.0 %   Stage 2 HR 61 bpm   Phase 3 Name Infusion    Stage 3 Name DOSE 1    Stage 3 Attribute Baseline    Stage 3 Time 00:00:01    Stage 3 Speed 0.0 mph   Stage 3 Grade 0.0 %   Stage 3 HR 61 bpm   Phase 4 Name Infusion    Stage 4 Name DOSE 1    Stage 4 Attribute Peak    Stage 4 Time 00:01:10    Stage 4 Speed 0.0 mph   Stage 4 Grade 0.0 %   Stage 4 HR 93 bpm   Phase 5 Name POSTINFSN    Stage 5 Attribute Recovery 38mn    Stage 5 Time 00:01:06    Stage 5 Speed 0.0 mph   Stage 5 Grade 0.0 %   Stage 5 HR 89 bpm   Stage 5 SBP 157 mmHg   Stage 5 DBP 144 mmHg   Phase 6 Name POSTINFSN    Stage 6 Time 00:05:36    Stage 6 Speed 0.0 mph   Stage 6 Grade 0.0 %   Stage 6 HR 72 bpm   Stage 6 SBP 143 mmHg   Stage 6 DBP 73 mmHg   Percent of predicted max HR 58 %  POCT Influenza A/B     Status: Abnormal   Collection Time: 05/01/15 11:16 AM  Result Value Ref Range   Influenza A, POC Positive (A) Negative   Influenza B, POC Negative Negative    1. Cough  - POCT Influenza A/B-pos A - azithromycin (ZITHROMAX) 250 MG tablet; Take 2 tabs on day 1, then 1 tab daily on days 2-5  Dispense: 6 tablet; Refill: 0  2. Fever and chills   3. Influenza A  - oseltamivir (TAMIFLU) 75 MG capsule; Take 1 capsule (75 mg total) by mouth 2 (two) times daily.  Dispense: 10 capsule; Refill: 0

## 2015-05-01 NOTE — Patient Instructions (Signed)
Increase fluids.  Tyhlenol or Advil for fever, chills.  Mucinex DM and Claritin as needed.

## 2015-06-15 ENCOUNTER — Ambulatory Visit: Payer: BLUE CROSS/BLUE SHIELD | Admitting: Family Medicine

## 2015-06-30 ENCOUNTER — Ambulatory Visit: Payer: BLUE CROSS/BLUE SHIELD | Admitting: Family Medicine

## 2015-08-03 ENCOUNTER — Encounter: Payer: Self-pay | Admitting: Family Medicine

## 2015-08-03 ENCOUNTER — Ambulatory Visit (INDEPENDENT_AMBULATORY_CARE_PROVIDER_SITE_OTHER): Payer: BLUE CROSS/BLUE SHIELD | Admitting: Family Medicine

## 2015-08-03 VITALS — BP 160/80 | HR 62 | Temp 97.9°F | Resp 16 | Ht 72.0 in | Wt 231.0 lb

## 2015-08-03 DIAGNOSIS — I10 Essential (primary) hypertension: Secondary | ICD-10-CM | POA: Diagnosis not present

## 2015-08-03 DIAGNOSIS — E119 Type 2 diabetes mellitus without complications: Secondary | ICD-10-CM | POA: Diagnosis not present

## 2015-08-03 DIAGNOSIS — E785 Hyperlipidemia, unspecified: Secondary | ICD-10-CM

## 2015-08-03 LAB — POCT GLYCOSYLATED HEMOGLOBIN (HGB A1C)

## 2015-08-03 MED ORDER — GLIMEPIRIDE 4 MG PO TABS: ORAL_TABLET | ORAL | Status: DC

## 2015-08-03 MED ORDER — AMLODIPINE BESYLATE 5 MG PO TABS: 5.0000 mg | ORAL_TABLET | Freq: Every day | ORAL | Status: DC

## 2015-08-03 NOTE — Progress Notes (Signed)
Name: Jonathan Faulkner   MRN: [id number]    DOB: 02-29-1950   Date:08/03/2015       Progress Note  Subjective  Chief Complaint  Chief Complaint  Patient presents with  . Diabetes    highest BS 230 and lowest BS 150 and avarage 180    HPI Here for f/u of DM and HBP.  He forgets his PM Metformin  About 3 nights a week.  He  Is working hard but still not eating too well.  No problem-specific assessment & plan notes found for this encounter.   Past Medical History  Diagnosis Date  . Hypertension   . Hypercholesterolemia   . GERD (gastroesophageal reflux disease)   . Diabetes mellitus     Type II  . Varicella without mention of complication     hx  . Measles     hx    Past Surgical History  Procedure Laterality Date  . None      Family History  Problem Relation Age of Onset  . Adopted: Yes  . Family history unknown: Yes    Social History   Social History  . Marital Status: Married    Spouse Name: N/A  . Number of Children: N/A  . Years of Education: N/A   Occupational History  . Not on file.   Social History Main Topics  . Smoking status: Current Every Day Smoker -- 47 years    Types: Cigars  . Smokeless tobacco: Never Used     Comment: 2-3 daily  . Alcohol Use: No  . Drug Use: No  . Sexual Activity: Not on file   Other Topics Concern  . Not on file   Social History Narrative     Current outpatient prescriptions:  .  amLODipine (NORVASC) 5 MG tablet, Take 1 tablet (5 mg total) by mouth daily., Disp: 90 tablet, Rfl: 3 .  atorvastatin (LIPITOR) 10 MG tablet, Take 1 tablet (10 mg total) by mouth daily at 6 PM., Disp: 90 tablet, Rfl: 3 .  glimepiride (AMARYL) 4 MG tablet, Take 2 tablets in AM for diabetes, Disp: 180 tablet, Rfl: 3 .  losartan (COZAAR) 100 MG tablet, Take 1 tablet (100 mg total) by mouth daily., Disp: 90 tablet, Rfl: 3 .  metFORMIN (GLUCOPHAGE) 500 MG tablet, Take 1 tablet (500 mg total) by mouth 2 (two) times daily with a meal.  (Patient taking differently: Take 500 mg by mouth 2 (two) times daily with a meal. Takes 1.5 tablets twice a day, but forgets evening dose about 3 nights a week), Disp: 180 tablet, Rfl: 3 .  naproxen (NAPROSYN) 500 MG tablet, Take 500 mg by mouth 2 (two) times daily with a meal., Disp: , Rfl:  .  omeprazole (PRILOSEC) 20 MG capsule, Take 1 capsule (20 mg total) by mouth 2 (two) times daily before a meal., Disp: 60 capsule, Rfl: 12 .  saxagliptin HCl (ONGLYZA) 5 MG TABS tablet, Take 1 tablet (5 mg total) by mouth daily., Disp: 30 tablet, Rfl: 6  Allergies  Allergen Reactions  . Iodine Hives     Review of Systems  Constitutional: Negative for fever, chills, weight loss and malaise/fatigue.  HENT: Negative for hearing loss.   Eyes: Negative for blurred vision and double vision.  Respiratory: Negative for cough, shortness of breath and wheezing.   Cardiovascular: Negative for chest pain, palpitations and leg swelling.  Gastrointestinal: Negative for heartburn, abdominal pain and blood in stool.  Genitourinary: Negative for dysuria, urgency and  frequency.  Skin: Negative for rash.  Neurological: Negative for dizziness, tremors, weakness and headaches.  Psychiatric/Behavioral: Negative for depression. The patient is not nervous/anxious and does not have insomnia.       Objective  Filed Vitals:   08/03/15 1551 08/03/15 1713  BP: 143/80 160/80  Pulse: 62   Temp: 97.9 F (36.6 C)   TempSrc: Oral   Resp: 16   Height: 6' (1.829 m)   Weight: 231 lb (104.781 kg)     Physical Exam  Constitutional: He is oriented to person, place, and time and well-developed, well-nourished, and in no distress. No distress.  HENT:  Head: Normocephalic and atraumatic.  Eyes: Conjunctivae and EOM are normal. Pupils are equal, round, and reactive to light. No scleral icterus.  Neck: Normal range of motion. Neck supple. Carotid bruit is not present. No thyromegaly present.  Cardiovascular: Normal rate,  regular rhythm and normal heart sounds.  Exam reveals no gallop and no friction rub.   No murmur heard. Pulmonary/Chest: Effort normal and breath sounds normal. No respiratory distress. He has no wheezes. He has no rales.  Musculoskeletal: He exhibits no edema.  Lymphadenopathy:    He has no cervical adenopathy.  Neurological: He is alert and oriented to person, place, and time.  Vitals reviewed.      Recent Results (from the past 2160 hour(s))  POCT HgB A1C     Status: Abnormal   Collection Time: 08/03/15  4:02 PM  Result Value Ref Range   Hemoglobin A1C 7.9%      Assessment & Plan  Problem List Items Addressed This Visit      Cardiovascular and Mediastinum   HTN (hypertension)   Relevant Medications   amLODipine (NORVASC) 5 MG tablet     Endocrine   Type 2 diabetes mellitus ([place]) - Primary   Relevant Medications   glimepiride (AMARYL) 4 MG tablet   Other Relevant Orders   POCT HgB A1C (Completed)     Other   Hyperlipidemia   Relevant Medications   amLODipine (NORVASC) 5 MG tablet      Meds ordered this encounter  Medications  . glimepiride (AMARYL) 4 MG tablet    Sig: Take 2 tablets in AM for diabetes    Dispense:  180 tablet    Refill:  3  . amLODipine (NORVASC) 5 MG tablet    Sig: Take 1 tablet (5 mg total) by mouth daily.    Dispense:  90 tablet    Refill:  3   1. Type 2 diabetes mellitus without complication, without long-term current use of insulin (HCC) Contg Onglyza and Metformin  - POCT HgB A1C-7.9  2. Essential hypertension Con t Losartan - amLODipine (NORVASC) 5 MG tablet; Take 1 tablet (5 mg total) by mouth daily.  Dispense: 90 tablet; Refill: 3  3. Hyperlipidemia Cont Lipitor  4. Type 2 diabetes mellitus without complication, unspecified long term insulin use status (HCC)  - glimepiride (AMARYL) 4 MG tablet; Take 2 tablets in AM for diabetes  Dispense: 180 tablet; Refill: 3

## 2015-08-24 ENCOUNTER — Other Ambulatory Visit: Payer: Self-pay | Admitting: Family Medicine

## 2015-09-19 ENCOUNTER — Ambulatory Visit: Payer: Self-pay | Admitting: Family Medicine

## 2015-10-05 ENCOUNTER — Encounter: Payer: Self-pay | Admitting: Family Medicine

## 2015-10-05 ENCOUNTER — Ambulatory Visit (INDEPENDENT_AMBULATORY_CARE_PROVIDER_SITE_OTHER): Payer: BLUE CROSS/BLUE SHIELD | Admitting: Family Medicine

## 2015-10-05 VITALS — BP 140/70 | HR 66 | Temp 97.6°F | Resp 16 | Ht 72.0 in | Wt 230.0 lb

## 2015-10-05 DIAGNOSIS — E669 Obesity, unspecified: Secondary | ICD-10-CM

## 2015-10-05 DIAGNOSIS — E119 Type 2 diabetes mellitus without complications: Secondary | ICD-10-CM

## 2015-10-05 DIAGNOSIS — E1100 Type 2 diabetes mellitus with hyperosmolarity without nonketotic hyperglycemic-hyperosmolar coma (NKHHC): Secondary | ICD-10-CM

## 2015-10-05 DIAGNOSIS — I10 Essential (primary) hypertension: Secondary | ICD-10-CM | POA: Diagnosis not present

## 2015-10-05 DIAGNOSIS — E785 Hyperlipidemia, unspecified: Secondary | ICD-10-CM | POA: Diagnosis not present

## 2015-10-05 MED ORDER — GLIMEPIRIDE 4 MG PO TABS: ORAL_TABLET | ORAL | 3 refills | Status: DC

## 2015-10-05 NOTE — Progress Notes (Signed)
Name: Jonathan Faulkner   MRN: [id number]    DOB: [birth year]   Date:10/05/2015       Progress Note  Subjective  Chief Complaint  Chief Complaint  Patient presents with  . Hypertension  . Gastroesophageal Reflux    HPI Here for f/u of HBP and GERD.  His BSs running into 200 still.  He has not been taking Glimeperide.  Thought he was supposed to be off it.   Cannot remember why it may have been stopped.  No problem-specific Assessment & Plan notes found for this encounter.   Past Medical History:  Diagnosis Date  . Diabetes mellitus    Type II  . GERD (gastroesophageal reflux disease)   . Hypercholesterolemia   . Hypertension   . Measles    hx  . Varicella without mention of complication    hx    Past Surgical History:  Procedure Laterality Date  . None      Family History  Problem Relation Age of Onset  . Adopted: Yes  . Family history unknown: Yes    Social History   Social History  . Marital status: Married    Spouse name: N/A  . Number of children: N/A  . Years of education: N/A   Occupational History  . Not on file.   Social History Main Topics  . Smoking status: Current Every Day Smoker    Years: 47.00    Types: Cigars  . Smokeless tobacco: Never Used     Comment: 2-3 daily  . Alcohol use No  . Drug use: No  . Sexual activity: Not on file   Other Topics Concern  . Not on file   Social History Narrative  . No narrative on file     Current Outpatient Prescriptions:  .  amLODipine (NORVASC) 5 MG tablet, Take 1 tablet (5 mg total) by mouth daily., Disp: 90 tablet, Rfl: 3 .  atorvastatin (LIPITOR) 10 MG tablet, Take 1 tablet (10 mg total) by mouth daily at 6 PM., Disp: 90 tablet, Rfl: 3 .  losartan (COZAAR) 100 MG tablet, Take 1 tablet (100 mg total) by mouth daily., Disp: 90 tablet, Rfl: 3 .  metFORMIN (GLUCOPHAGE-XR) 500 MG 24 hr tablet, TAKE 1 TABLET IN THE MORNING AND TAKE 2 TABLETS IN THE EVENING, Disp: 90 tablet, Rfl: 12 .  naproxen  (NAPROSYN) 500 MG tablet, TAKE ONE (1) TABLET BY MOUTH TWO (2) TIMES DAILY, Disp: 60 tablet, Rfl: 6 .  omeprazole (PRILOSEC) 20 MG capsule, TAKE ONE (1) CAPSULE BY MOUTH 2 TIMES DAILY BEFORE A MEAL, Disp: 60 capsule, Rfl: 12 .  saxagliptin HCl (ONGLYZA) 5 MG TABS tablet, Take 1 tablet (5 mg total) by mouth daily., Disp: 30 tablet, Rfl: 6 .  glimepiride (AMARYL) 4 MG tablet, Take 2 tablets in AM for diabetes, Disp: 180 tablet, Rfl: 3  Allergies  Allergen Reactions  . Iodine Hives     Review of Systems  Constitutional: Negative for chills, fever, malaise/fatigue and weight loss.  HENT: Negative for hearing loss.   Eyes: Negative for blurred vision and double vision.  Respiratory: Negative for cough, shortness of breath and wheezing.   Cardiovascular: Negative for chest pain, palpitations and leg swelling.  Gastrointestinal: Negative for abdominal pain, blood in stool and heartburn.  Genitourinary: Negative for dysuria, frequency and urgency.  Musculoskeletal: Negative for joint pain and myalgias.  Skin: Negative for rash.  Neurological: Negative for dizziness, tremors, weakness and headaches.      Objective  Vitals:   10/05/15 1555 10/05/15 1629  BP: (!) 157/85 140/70  Pulse: 66   Resp: 16   Temp: 97.6 F (36.4 C)   TempSrc: Oral   Weight: 230 lb (104.3 kg)   Height: 6' (1.829 m)     Physical Exam  Constitutional: He is oriented to person, place, and time and well-developed, well-nourished, and in no distress. No distress.  HENT:  Head: Normocephalic and atraumatic.  Eyes: Conjunctivae and EOM are normal. Pupils are equal, round, and reactive to light. No scleral icterus.  Neck: Normal range of motion. Neck supple. Carotid bruit is not present. No thyromegaly present.  Cardiovascular: Normal rate, regular rhythm and normal heart sounds.  Exam reveals no gallop and no friction rub.   No murmur heard. Pulmonary/Chest: Effort normal and breath sounds normal. No respiratory  distress. He has no wheezes. He has no rales.  Abdominal: Soft. Bowel sounds are normal. He exhibits no distension and no mass. There is no tenderness.  Musculoskeletal: He exhibits no edema.  Lymphadenopathy:    He has no cervical adenopathy.  Neurological: He is alert and oriented to person, place, and time.  Vitals reviewed.      Recent Results (from the past 2160 hour(s))  POCT HgB A1C     Status: Abnormal   Collection Time: 08/03/15  4:02 PM  Result Value Ref Range   Hemoglobin A1C 7.9%      Assessment & Plan  Problem List Items Addressed This Visit      Cardiovascular and Mediastinum   HTN (hypertension) - Primary     Endocrine   Type 2 diabetes mellitus (HCC)   Relevant Medications   glimepiride (AMARYL) 4 MG tablet     Other   Obese   Relevant Medications   glimepiride (AMARYL) 4 MG tablet   Hyperlipidemia    Other Visit Diagnoses   None.     Meds ordered this encounter  Medications  . glimepiride (AMARYL) 4 MG tablet    Sig: Take 2 tablets in AM for diabetes    Dispense:  180 tablet    Refill:  3   1. Essential hypertension Cont  Amlodipine and Losartan  2. Type 2 diabetes mellitus with hyperosmolarity without coma, without long-term current use of insulin (HCC) Cont. Metformin and Onglyza - glimepiride (AMARYL) 4 MG tablet; Take 2 tablets in AM for diabetes  Dispense: 180 tablet; Refill: 3  3. Obese   4. Hyperlipidemia Cont. Lipitor  5. Type 2 diabetes mellitus without complication, unspecified long term insulin use status (HCC)  - glimepiride (AMARYL) 4 MG tablet; Take 2 tablets in AM for diabetes  Dispense: 180 tablet; Refill: 3

## 2015-12-11 ENCOUNTER — Other Ambulatory Visit: Payer: Self-pay | Admitting: Family Medicine

## 2015-12-12 ENCOUNTER — Ambulatory Visit (INDEPENDENT_AMBULATORY_CARE_PROVIDER_SITE_OTHER): Payer: BLUE CROSS/BLUE SHIELD | Admitting: Family Medicine

## 2015-12-12 ENCOUNTER — Encounter: Payer: Self-pay | Admitting: Family Medicine

## 2015-12-12 VITALS — BP 134/72 | HR 60 | Temp 98.0°F | Resp 16 | Ht 72.0 in | Wt 225.0 lb

## 2015-12-12 DIAGNOSIS — J329 Chronic sinusitis, unspecified: Secondary | ICD-10-CM

## 2015-12-12 DIAGNOSIS — J309 Allergic rhinitis, unspecified: Secondary | ICD-10-CM | POA: Insufficient documentation

## 2015-12-12 DIAGNOSIS — J302 Other seasonal allergic rhinitis: Secondary | ICD-10-CM

## 2015-12-12 DIAGNOSIS — Z720 Tobacco use: Secondary | ICD-10-CM

## 2015-12-12 DIAGNOSIS — J40 Bronchitis, not specified as acute or chronic: Secondary | ICD-10-CM

## 2015-12-12 MED ORDER — PREDNISONE 50 MG PO TABS: 50.0000 mg | ORAL_TABLET | Freq: Every day | ORAL | 0 refills | Status: DC

## 2015-12-12 MED ORDER — FLUTICASONE PROPIONATE 50 MCG/ACT NA SUSP: 2.0000 | Freq: Every day | NASAL | 2 refills | Status: DC

## 2015-12-12 NOTE — Assessment & Plan Note (Addendum)
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 Consider Spirometry in future. Smoking cessation counseling provided Not ready to quit

## 2015-12-12 NOTE — Progress Notes (Signed)
Subjective:    [name], male    DOB: [birth year], 65 y.o.   MRN: [id number]  Jonathan Faulkner is a 65 y.o. male presenting on 12/12/2015 for chest Congestion (Coughing up green mucous sometimes white, and onset for 1 and a half months.)  Patient presents for a same day appointment.  HPI   CHRONIC RHINOSINUSITIS, Recurrent Bronchitis / TOBACCO ABUSE Reports onset symptoms for past 1 month, initially with nasal congestion and URI symptoms (admits several sick contacts), since mid September 2017, then worsening with chest congestion with persistent coughing. On 9/19 his PCP phoned in an azithromycin Z-pak antibiotic, completed this course with significant improvement for several days but did not completely resolve, chest congestion symptoms returned within 1 week, now worsening again. - Also admits to bilateral ear pressure and fullness, worse in Left > Right, he states that one episode with Left ear fullness he used shower head to flush out ear wax and Q-tip, got a large hard piece of wax - Active smoker, cigars only 3-4 every day, no prior diagnosis of COPD, or similar prolonged flare-ups - No prior history of allergies, not on loratadine or flonase - Denies any fevers/chills, nausea, vomiting, abdominal pain, diarrhea, chest pain, dyspnea   Social History  Substance Use Topics  . Smoking status: Current Every Day Smoker    Years: 47.00    Types: Cigars  . Smokeless tobacco: Never Used     Comment: 3-4 day  . Alcohol use No    Review of Systems Per HPI unless specifically indicated above     Objective:    BP 134/72 (BP Location: Left Arm, Patient Position: Sitting, Cuff Size: Large)   Pulse 60   Temp 98 F (36.7 C) (Oral)   Resp 16   Ht 6' (1.829 m)   Wt 225 lb (102.1 kg)   BMI 30.52 kg/m   Wt Readings from Last 3 Encounters:  12/12/15 225 lb (102.1 kg)  10/05/15 230 lb (104.3 kg)  08/03/15 231 lb (104.8 kg)    Physical Exam  Constitutional: He  appears well-developed and well-nourished. No distress.  Well-appearing, comfortable, cooperative  HENT:  Head: Normocephalic and atraumatic.  Frontal / maxillary sinuses non-tender. Nares mild turbinate edema without significant congestion or purulence. Bilateral TMs clear without erythema, effusion or bulging. Oropharynx clear without erythema, exudates, edema or asymmetry.  Eyes: Conjunctivae are normal. Right eye exhibits no discharge. Left eye exhibits no discharge.  Neck: Normal range of motion. Neck supple. No thyromegaly present.  Cardiovascular: Normal rate, regular rhythm, normal heart sounds and intact distal pulses.   No murmur heard. Pulmonary/Chest: Effort normal. No respiratory distress. He has no wheezes. He has no rales.  Diffuse bilateral coarse wheezing with some reduced air movement.  Musculoskeletal: He exhibits no edema.  Lymphadenopathy:    He has no cervical adenopathy.  Neurological: He is alert.  Skin: Skin is warm and dry. He is not diaphoretic.  Nursing note and vitals reviewed.    Results for orders placed or performed in visit on 08/03/15  POCT HgB A1C  Result Value Ref Range   Hemoglobin A1C 7.9%       Assessment & Plan:   Problem List Items Addressed This Visit    Tobacco abuse    Active smoker, concern for possible early vs mild COPD with recurrent persistent bronchitis. Consider Spirometry in future. Smoking cessation counseling provided Not ready to quit      Allergic rhinitis due to  allergen    Likely contributing to recurrent bronchitis with upper respiratory sinus symptoms with persistent congestion and postnasal drainage - Also at risk with outdoor work especially environmental exposures at work - Start Loratadine OTC 10mg  daily 1 month, consider longer - Sent rx Flonase 2 sprays each nare daily, 1 month consider longer use - Nasal saline PRN      Relevant Medications   fluticasone (FLONASE) 50 MCG/ACT nasal spray    Other Visit  Diagnoses    Chronic sinusitis with recurrent bronchitis    -  Primary  Consistent with recurrent worsening bronchitis in setting of likely viral URI vs allergic rhinosinusitis, now >1 month - Afebrile, no focal signs of infection (not consistent with pneumonia by history or exam), no evidence sinusitis. Mild coarse breath sounds on exam concern for some bronchospasm (without history of COPD, but active smoker), mild reduced air movement. - Improved but not resolved on Azithromycin 1 month ago  Plan: 1. Start Prednisone 50mg  daily x 5 days burst treatment given bronchospasm, if not improving 24-48 hours, consider add different antibiotics with levaquin 2. Start OTC - Mucinex x 7 days, Loratadine, Flonase, improve hydration, nasal saline 3. Return criteria reviewed, follow-up within 1-2 week if not improved     Relevant Medications   predniSONE (DELTASONE) 50 MG tablet   fluticasone (FLONASE) 50 MCG/ACT nasal spray      Meds ordered this encounter  Medications  . predniSONE (DELTASONE) 50 MG tablet    Sig: Take 1 tablet (50 mg total) by mouth daily with breakfast.    Dispense:  5 tablet    Refill:  0  . fluticasone (FLONASE) 50 MCG/ACT nasal spray    Sig: Place 2 sprays into both nostrils daily. For 1 month, then as needed    Dispense:  16 g    Refill:  2      Follow up plan: Return in about 2 weeks (around 12/26/2015), or if symptoms worsen or fail to improve, for bronchitis.  Follow-up for flu shot 2 weeks after current illness resolved.  Nobie Putnam, [facility] 12/12/2015, 11:39 PM

## 2015-12-12 NOTE — Assessment & Plan Note (Signed)
Likely contributing to recurrent bronchitis with upper respiratory sinus symptoms with persistent congestion and postnasal drainage - Also at risk with outdoor work especially environmental exposures at work - Start Loratadine OTC 10mg  daily 1 month, consider longer - Sent rx Flonase 2 sprays each nare daily, 1 month consider longer use - Nasal saline PRN

## 2015-12-12 NOTE — Patient Instructions (Signed)
Thank you for coming in to clinic today.  1. It sounds like you had an Upper Respiratory Virus that has settled into a Bronchitis, lower respiratory tract infection. I don't have concerns for pneumonia today, and think that this should gradually improve. Once you are feeling better, the cough may take a few weeks to fully resolve. I do hear wheezing and coarse breath sounds, this may be related to smoking. - Start Prednisone 50mg  daily for next 5 days - this will open up lungs allow you to breath better and treat that wheezing or bronchospasm - Start Over the Counter - Mucinex-DM for 1 week or less, to help clear the mucus - Start Loratadine (Claritin) 10mg  daily - take this for 1 month to help dry up sinuses, may need for longer with allergies - Start Flonase 2 sprays in each nostril daily for 1 month, may need to use longer if persistent allergies - Drink plenty of fluids to improve congestion  Remember to try to quit and cut back smoking. 1 [id number]-QUIT NOW  If symptoms worsen or change with sinus pain pressure, fevers/chills, shortness of breath, then follow-up sooner, may need x-ray and other treatment  Please schedule a follow-up appointment with Dr. Parks Ranger or Dr Luan Pulling if not improved within 2 weeks Bronchitis  If you have any other questions or concerns, please feel free to call the clinic or send a message through [place]. You may also schedule an earlier appointment if necessary.  Nobie Putnam, DO [place]

## 2016-01-02 ENCOUNTER — Encounter: Payer: Self-pay | Admitting: Family Medicine

## 2016-01-02 ENCOUNTER — Ambulatory Visit (INDEPENDENT_AMBULATORY_CARE_PROVIDER_SITE_OTHER): Payer: BLUE CROSS/BLUE SHIELD | Admitting: Family Medicine

## 2016-01-02 VITALS — BP 130/75 | HR 76 | Temp 98.7°F | Resp 16 | Ht 72.0 in | Wt 223.0 lb

## 2016-01-02 DIAGNOSIS — E7849 Other hyperlipidemia: Secondary | ICD-10-CM

## 2016-01-02 DIAGNOSIS — L739 Follicular disorder, unspecified: Secondary | ICD-10-CM | POA: Diagnosis not present

## 2016-01-02 DIAGNOSIS — Z23 Encounter for immunization: Secondary | ICD-10-CM

## 2016-01-02 DIAGNOSIS — E119 Type 2 diabetes mellitus without complications: Secondary | ICD-10-CM

## 2016-01-02 DIAGNOSIS — I10 Essential (primary) hypertension: Secondary | ICD-10-CM

## 2016-01-02 DIAGNOSIS — E784 Other hyperlipidemia: Secondary | ICD-10-CM | POA: Diagnosis not present

## 2016-01-02 MED ORDER — DOXYCYCLINE HYCLATE 100 MG PO TABS: 100.0000 mg | ORAL_TABLET | Freq: Two times a day (BID) | ORAL | 0 refills | Status: DC

## 2016-01-02 NOTE — Patient Instructions (Signed)
Needs to check blood sugars daily.

## 2016-01-02 NOTE — Progress Notes (Signed)
Name: Jonathan Faulkner   MRN: [id number]    DOB: Feb 29, 1950   Date:01/02/2016       Progress Note  Subjective  Chief Complaint  Chief Complaint  Patient presents with  . Hypertension  . Gastroesophageal Reflux  . Diabetes  . URI    HPI Here for f/u of DM, HBP, hyperlipidemia.  He has cont to lose weight.  BSs not checked at home.   He was treated for sinusitis and bronchitis with pednisone and Flonase NS 2 weeks ago.  Had completed course of Z-pak about 2-4 weeks before. He has recurent L ear pressure and pnd.  Cough productive of white-green sputum.  No fever.  No SOB.  No chills or weakness.    No problem-specific Assessment & Plan notes found for this encounter.   Past Medical History:  Diagnosis Date  . Diabetes mellitus    Type II  . GERD (gastroesophageal reflux disease)   . Hypercholesterolemia   . Hypertension   . Measles    hx  . Varicella without mention of complication    hx    Past Surgical History:  Procedure Laterality Date  . None      Family History  Problem Relation Age of Onset  . Adopted: Yes  . Family history unknown: Yes    Social History   Social History  . Marital status: Married    Spouse name: N/A  . Number of children: N/A  . Years of education: N/A   Occupational History  . Not on file.   Social History Main Topics  . Smoking status: Current Every Day Smoker    Years: 47.00    Types: Cigars  . Smokeless tobacco: Never Used     Comment: 3-4 day  . Alcohol use No  . Drug use: No  . Sexual activity: Yes   Other Topics Concern  . Not on file   Social History Narrative  . No narrative on file     Current Outpatient Prescriptions:  .  amLODipine (NORVASC) 5 MG tablet, Take 1 tablet (5 mg total) by mouth daily., Disp: 90 tablet, Rfl: 3 .  atorvastatin (LIPITOR) 10 MG tablet, Take 1 tablet (10 mg total) by mouth daily at 6 PM., Disp: 90 tablet, Rfl: 3 .  glimepiride (AMARYL) 4 MG tablet, Take 2 tablets in AM for diabetes  (Patient taking differently: 4 mg. Take 2 tablets in AM for diabetes), Disp: 180 tablet, Rfl: 3 .  losartan (COZAAR) 100 MG tablet, TAKE ONE (1) TABLET BY MOUTH EVERY DAY, Disp: 90 tablet, Rfl: 3 .  metFORMIN (GLUCOPHAGE-XR) 500 MG 24 hr tablet, TAKE 1 TABLET IN THE MORNING AND TAKE 2 TABLETS IN THE EVENING, Disp: 90 tablet, Rfl: 12 .  naproxen (NAPROSYN) 500 MG tablet, TAKE ONE (1) TABLET BY MOUTH TWO (2) TIMES DAILY, Disp: 60 tablet, Rfl: 6 .  omeprazole (PRILOSEC) 20 MG capsule, TAKE ONE (1) CAPSULE BY MOUTH 2 TIMES DAILY BEFORE A MEAL, Disp: 60 capsule, Rfl: 12 .  saxagliptin HCl (ONGLYZA) 5 MG TABS tablet, Take 1 tablet (5 mg total) by mouth daily., Disp: 30 tablet, Rfl: 6 .  doxycycline (VIBRA-TABS) 100 MG tablet, Take 1 tablet (100 mg total) by mouth 2 (two) times daily., Disp: 20 tablet, Rfl: 0 .  fluticasone (FLONASE) 50 MCG/ACT nasal spray, Place 2 sprays into both nostrils daily. For 1 month, then as needed (Patient not taking: Reported on 01/02/2016), Disp: 16 g, Rfl: 2 .  predniSONE (DELTASONE) 50  MG tablet, Take 1 tablet (50 mg total) by mouth daily with breakfast. (Patient not taking: Reported on 01/02/2016), Disp: 5 tablet, Rfl: 0  Allergies  Allergen Reactions  . Iodine Hives     Review of Systems  Constitutional: Negative for chills, fever, malaise/fatigue and weight loss.  HENT: Positive for ear pain. Negative for hearing loss, sore throat and tinnitus.   Eyes: Negative for blurred vision and double vision.  Respiratory: Positive for cough and sputum production. Negative for hemoptysis, shortness of breath and wheezing.   Cardiovascular: Negative for chest pain, palpitations and leg swelling.  Gastrointestinal: Negative for abdominal pain, blood in stool and heartburn.  Genitourinary: Negative for dysuria, frequency and urgency.  Musculoskeletal: Negative for joint pain and myalgias.  Skin: Negative for rash.  Neurological: Negative for dizziness, tingling, tremors,  weakness and headaches.      Objective  Vitals:   01/02/16 1530 01/02/16 1644  BP: 120/77 130/75  Pulse: 76   Resp: 16   Temp: 98.7 F (37.1 C)   TempSrc: Oral   Weight: 223 lb (101.2 kg)   Height: 6' (1.829 m)     Physical Exam  Constitutional: He is oriented to person, place, and time and well-developed, well-nourished, and in no distress. No distress.  HENT:  Head: Normocephalic and atraumatic.  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 Pupils are equal, round, and reactive to light. No scleral icterus.  Neck: Normal range of motion. Neck supple. Carotid bruit is not present. No thyromegaly present.  Cardiovascular: Normal rate, regular rhythm and normal heart sounds.  Exam reveals no gallop and no friction rub.   No murmur heard. Pulmonary/Chest: Effort normal and breath sounds normal. No respiratory distress. He has no wheezes. He has no rales.  Musculoskeletal: He exhibits no edema.  Lymphadenopathy:    He has no cervical adenopathy.  Neurological: He is alert and oriented to person, place, and time.  Vitals reviewed.      No results found for this or any previous visit (from the past 2160 hour(s)).   Assessment & Plan  Problem List Items Addressed This Visit      Cardiovascular and Mediastinum   HTN (hypertension)   Relevant Orders   COMPLETE METABOLIC PANEL WITH GFR     Endocrine   Type 2 diabetes mellitus ([place]) - Primary   Relevant Orders   HgB A1c     Other   Hyperlipidemia   Relevant Orders   Lipid Profile    Other Visit Diagnoses    Folliculitis       Relevant Medications   doxycycline (VIBRA-TABS) 100 MG tablet   Other Relevant Orders   CBC with Differential   Immunization due       Relevant Medications   doxycycline (VIBRA-TABS) 100 MG tablet   Other Relevant Orders   Flu Vaccine QUAD 36+ mos PF IM (Fluarix & Fluzone Quad PF) (Completed)      Meds ordered this encounter   Medications  . doxycycline (VIBRA-TABS) 100 MG tablet    Sig: Take 1 tablet (100 mg total) by mouth 2 (two) times daily.    Dispense:  20 tablet    Refill:  0   1. Type 2 diabetes mellitus without complication, without long-term current use of insulin (HCC) Cont Metformin, Onglyza, and Amaryl 2. Essential hypertension Cont Amlodipine, Losartan - COMPLETE METABOLIC PANEL WITH GFR -0000000 3. Other hyperlipidemia Cont meds - Lipid Profile  4.  Folliculitis  - CBC with Differential - doxycycline (VIBRA-TABS) 100 MG tablet; Take 1 tablet (100 mg total) by mouth 2 (two) times daily.  Dispense: 20 tablet; Refill: 0  5. Immunization due  - Flu Vaccine QUAD 36+ mos PF IM (Fluarix & Fluzone Quad PF) - doxycycline (VIBRA-TABS) 100 MG tablet; Take 1 tablet (100 mg total) by mouth 2 (two) times daily.  Dispense: 20 tablet; Refill: 0

## 2016-01-09 ENCOUNTER — Other Ambulatory Visit: Payer: BLUE CROSS/BLUE SHIELD

## 2016-01-09 LAB — CBC WITH DIFFERENTIAL/PLATELET
BASOS PCT: 0 %
Basophils Absolute: 0 cells/uL (ref 0–200)
EOS PCT: 4 %
Eosinophils Absolute: 212 cells/uL (ref 15–500)
HEMATOCRIT: 50.2 % — AB (ref 38.5–50.0)
Hemoglobin: 17.1 g/dL (ref 13.2–17.1)
LYMPHS PCT: 32 %
Lymphs Abs: 1696 cells/uL (ref 850–3900)
MCH: 31.5 pg (ref 27.0–33.0)
MCHC: 34.1 g/dL (ref 32.0–36.0)
MCV: 92.4 fL (ref 80.0–100.0)
MONO ABS: 424 {cells}/uL (ref 200–950)
MPV: 10.1 fL (ref 7.5–12.5)
Monocytes Relative: 8 %
Neutro Abs: 2968 cells/uL (ref 1500–7800)
Neutrophils Relative %: 56 %
PLATELETS: 257 10*3/uL (ref 140–400)
RBC: 5.43 MIL/uL (ref 4.20–5.80)
RDW: 14.2 % (ref 11.0–15.0)
WBC: 5.3 10*3/uL (ref 3.8–10.8)

## 2016-01-10 LAB — COMPLETE METABOLIC PANEL WITH GFR
ALK PHOS: 62 U/L (ref 40–115)
ALT: 46 U/L (ref 9–46)
AST: 20 U/L (ref 10–35)
Albumin: 4 g/dL (ref 3.6–5.1)
BUN: 14 mg/dL (ref 7–25)
CALCIUM: 9.5 mg/dL (ref 8.6–10.3)
CHLORIDE: 104 mmol/L (ref 98–110)
CO2: 26 mmol/L (ref 20–31)
CREATININE: 0.98 mg/dL (ref 0.70–1.25)
GFR, Est Non African American: 82 mL/min (ref >=60)
Glucose, Bld: 306 mg/dL — ABNORMAL HIGH (ref 65–99)
POTASSIUM: 4.7 mmol/L (ref 3.5–5.3)
Sodium: 140 mmol/L (ref 135–146)
Total Bilirubin: 0.4 mg/dL (ref 0.2–1.2)
Total Protein: 6.3 g/dL (ref 6.1–8.1)

## 2016-01-10 LAB — LIPID PANEL
CHOLESTEROL: 145 mg/dL (ref <200)
HDL: 31 mg/dL — AB (ref >40)
LDL Cholesterol: 95 mg/dL (ref <100)
TRIGLYCERIDES: 96 mg/dL (ref <150)
Total CHOL/HDL Ratio: 4.7 Ratio (ref <5.0)
VLDL: 19 mg/dL (ref <30)

## 2016-01-10 LAB — HEMOGLOBIN A1C
Hgb A1c MFr Bld: 11 % — ABNORMAL HIGH (ref <5.7)
MEAN PLASMA GLUCOSE: 269 mg/dL

## 2016-01-26 ENCOUNTER — Other Ambulatory Visit: Payer: Self-pay | Admitting: Family Medicine

## 2016-04-09 ENCOUNTER — Other Ambulatory Visit: Payer: Self-pay | Admitting: *Deleted

## 2016-04-09 ENCOUNTER — Ambulatory Visit (INDEPENDENT_AMBULATORY_CARE_PROVIDER_SITE_OTHER): Payer: BLUE CROSS/BLUE SHIELD | Admitting: Family Medicine

## 2016-04-09 ENCOUNTER — Encounter: Payer: Self-pay | Admitting: Family Medicine

## 2016-04-09 VITALS — BP 135/73 | HR 64 | Temp 98.0°F | Resp 16 | Ht 72.0 in | Wt 226.0 lb

## 2016-04-09 DIAGNOSIS — I10 Essential (primary) hypertension: Secondary | ICD-10-CM | POA: Diagnosis not present

## 2016-04-09 DIAGNOSIS — E784 Other hyperlipidemia: Secondary | ICD-10-CM | POA: Diagnosis not present

## 2016-04-09 DIAGNOSIS — N400 Enlarged prostate without lower urinary tract symptoms: Secondary | ICD-10-CM | POA: Diagnosis not present

## 2016-04-09 DIAGNOSIS — E119 Type 2 diabetes mellitus without complications: Secondary | ICD-10-CM

## 2016-04-09 DIAGNOSIS — E7849 Other hyperlipidemia: Secondary | ICD-10-CM

## 2016-04-09 MED ORDER — SAXAGLIPTIN HCL 5 MG PO TABS: 5.0000 mg | ORAL_TABLET | Freq: Every day | ORAL | 6 refills | Status: DC

## 2016-04-09 NOTE — Progress Notes (Signed)
Name: Jonathan Faulkner   MRN: [id number]    DOB: 29-Aug-1950   Date:04/09/2016       Progress Note  Subjective  Chief Complaint  Chief Complaint  Patient presents with  . Diabetes  . Hypertension  . Hyperlipidemia    HPI Here for f/u of DM and HBP and elevated lipids.  Has some hx of BPH in past but doing ok at present.  HE is not taking Onglyza because he didn't want to take it.  Taking all other meds..  He has not any weight.  No problem-specific Assessment & Plan notes found for this encounter.   Past Medical History:  Diagnosis Date  . Diabetes mellitus    Type II  . GERD (gastroesophageal reflux disease)   . Hypercholesterolemia   . Hypertension   . Measles    hx  . Varicella without mention of complication    hx    Past Surgical History:  Procedure Laterality Date  . None      Family History  Problem Relation Age of Onset  . Adopted: Yes  . Family history unknown: Yes    Social History   Social History  . Marital status: Married    Spouse name: N/A  . Number of children: N/A  . Years of education: N/A   Occupational History  . Not on file.   Social History Main Topics  . Smoking status: Current Every Day Smoker    Years: 47.00    Types: Cigars  . Smokeless tobacco: Never Used     Comment: 3-4 day  . Alcohol use No  . Drug use: No  . Sexual activity: Yes   Other Topics Concern  . Not on file   Social History Narrative  . No narrative on file     Current Outpatient Prescriptions:  .  amLODipine (NORVASC) 5 MG tablet, Take 1 tablet (5 mg total) by mouth daily., Disp: 90 tablet, Rfl: 3 .  atorvastatin (LIPITOR) 10 MG tablet, TAKE ONE (1) TABLET BY MOUTH EVERY DAY AT SIX IN THE EVENING, Disp: 90 tablet, Rfl: 2 .  fluticasone (FLONASE) 50 MCG/ACT nasal spray, Place 2 sprays into both nostrils daily. For 1 month, then as needed, Disp: 16 g, Rfl: 2 .  glimepiride (AMARYL) 4 MG tablet, Take 2 tablets in AM for diabetes (Patient taking  differently: 4 mg. Take 2 tablets in AM for diabetes), Disp: 180 tablet, Rfl: 3 .  losartan (COZAAR) 100 MG tablet, TAKE ONE (1) TABLET BY MOUTH EVERY DAY, Disp: 90 tablet, Rfl: 3 .  metFORMIN (GLUCOPHAGE-XR) 500 MG 24 hr tablet, TAKE 1 TABLET IN THE MORNING AND TAKE 2 TABLETS IN THE EVENING (Patient taking differently: Taking 1.5 tablets twice a dayh), Disp: 90 tablet, Rfl: 12 .  naproxen (NAPROSYN) 500 MG tablet, TAKE ONE (1) TABLET BY MOUTH TWO (2) TIMES DAILY (Patient taking differently: as needed), Disp: 60 tablet, Rfl: 6 .  omeprazole (PRILOSEC) 20 MG capsule, TAKE ONE (1) CAPSULE BY MOUTH 2 TIMES DAILY BEFORE A MEAL, Disp: 60 capsule, Rfl: 12 .  saxagliptin HCl (ONGLYZA) 5 MG TABS tablet, Take 1 tablet (5 mg total) by mouth daily., Disp: 30 tablet, Rfl: 6  Allergies  Allergen Reactions  . Iodine Hives     Review of Systems  Constitutional: Negative for chills, fever, malaise/fatigue and weight loss.  HENT: Negative for hearing loss and tinnitus.   Eyes: Negative for blurred vision and double vision.  Respiratory: Negative for cough, shortness of  breath and wheezing.   Cardiovascular: Negative for chest pain, palpitations and leg swelling.  Gastrointestinal: Negative for abdominal pain, blood in stool and heartburn.  Genitourinary: Negative for dysuria, frequency and urgency.  Musculoskeletal: Negative for myalgias.  Skin: Negative for rash.  Neurological: Negative for dizziness, tingling, tremors, weakness and headaches.      Objective  Vitals:   04/09/16 1541  BP: 135/73  Pulse: 64  Resp: 16  Temp: 98 F (36.7 C)  TempSrc: Oral  Weight: 226 lb (102.5 kg)  Height: 6' (1.829 m)    Physical Exam  Constitutional: He is oriented to person, place, and time and well-developed, well-nourished, and in no distress. No distress.  HENT:  Head: Normocephalic and atraumatic.  Eyes: Conjunctivae and EOM are normal. Pupils are equal, round, and reactive to light. No scleral  icterus.  Neck: Normal range of motion. Neck supple. Carotid bruit is not present. No thyromegaly present.  Cardiovascular: Normal rate, regular rhythm and normal heart sounds.  Exam reveals no gallop and no friction rub.   No murmur heard. Pulmonary/Chest: Effort normal and breath sounds normal. No respiratory distress. He has no wheezes. He has no rales.  Abdominal: Soft. Bowel sounds are normal. He exhibits no distension, no abdominal bruit and no mass. There is no tenderness.  Musculoskeletal: Normal range of motion. He exhibits no edema.  Lymphadenopathy:    He has no cervical adenopathy.  Neurological: He is alert and oriented to person, place, and time.  Vitals reviewed.      No results found for this or any previous visit (from the past 2160 hour(s)).   Assessment & Plan  Problem List Items Addressed This Visit      Cardiovascular and Mediastinum   HTN (hypertension)   Relevant Orders   COMPLETE METABOLIC PANEL WITH GFR   CBC with Differential     Endocrine   Type 2 diabetes mellitus (HCC) - Primary   Relevant Medications   saxagliptin HCl (ONGLYZA) 5 MG TABS tablet   Other Relevant Orders   HgB A1c     Other   Hyperlipidemia   Relevant Orders   Lipid Profile    Other Visit Diagnoses    Benign prostatic hyperplasia without lower urinary tract symptoms       Relevant Orders   PSA      Meds ordered this encounter  Medications  . saxagliptin HCl (ONGLYZA) 5 MG TABS tablet    Sig: Take 1 tablet (5 mg total) by mouth daily.    Dispense:  30 tablet    Refill:  6   1. Type 2 diabetes mellitus without complication, without long-term current use of insulin (HCC) Cont  Metformin and Amaryl - HgB A1c  2. Essential hypertension cont Amlodipine, Losartan - COMPLETE METABOLIC PANEL WITH GFR - CBC with Differential  3. Other hyperlipidemia Cont Lipitor - Lipid Profile  4. Type 2 diabetes mellitus without complication, unspecified long term insulin use  status (HCC) Cont to lose weight - saxagliptin HCl (ONGLYZA) 5 MG TABS tablet; Take 1 tablet (5 mg total) by mouth daily.  Dispense: 30 tablet; Refill: 6  5. Benign prostatic hyperplasia without lower urinary tract symptoms  - PSA

## 2016-04-10 LAB — COMPLETE METABOLIC PANEL WITH GFR
ALT: 35 U/L (ref 9–46)
AST: 18 U/L (ref 10–35)
Albumin: 4.2 g/dL (ref 3.6–5.1)
Alkaline Phosphatase: 58 U/L (ref 40–115)
BUN: 14 mg/dL (ref 7–25)
CO2: 23 mmol/L (ref 20–31)
CREATININE: 0.84 mg/dL (ref 0.70–1.25)
Calcium: 9.5 mg/dL (ref 8.6–10.3)
Chloride: 104 mmol/L (ref 98–110)
GFR, Est African American: >89 mL/min (ref >=60)
GFR, Est Non African American: >89 mL/min (ref >=60)
GLUCOSE: 200 mg/dL — AB (ref 65–99)
Potassium: 4.1 mmol/L (ref 3.5–5.3)
SODIUM: 137 mmol/L (ref 135–146)
TOTAL PROTEIN: 6.8 g/dL (ref 6.1–8.1)
Total Bilirubin: 0.7 mg/dL (ref 0.2–1.2)

## 2016-04-10 LAB — CBC WITH DIFFERENTIAL/PLATELET
BASOS ABS: 60 {cells}/uL (ref 0–200)
Basophils Relative: 1 %
EOS ABS: 180 {cells}/uL (ref 15–500)
Eosinophils Relative: 3 %
HEMATOCRIT: 49 % (ref 38.5–50.0)
Hemoglobin: 17 g/dL (ref 13.2–17.1)
LYMPHS PCT: 31 %
Lymphs Abs: 1860 cells/uL (ref 850–3900)
MCH: 32 pg (ref 27.0–33.0)
MCHC: 34.7 g/dL (ref 32.0–36.0)
MCV: 92.3 fL (ref 80.0–100.0)
MONO ABS: 480 {cells}/uL (ref 200–950)
MPV: 9.6 fL (ref 7.5–12.5)
Monocytes Relative: 8 %
Neutro Abs: 3420 cells/uL (ref 1500–7800)
Neutrophils Relative %: 57 %
PLATELETS: 227 10*3/uL (ref 140–400)
RBC: 5.31 MIL/uL (ref 4.20–5.80)
RDW: 14.1 % (ref 11.0–15.0)
WBC: 6 10*3/uL (ref 3.8–10.8)

## 2016-04-10 LAB — PSA: PSA: 0.6 ng/mL (ref <=4.0)

## 2016-04-10 LAB — LIPID PANEL
CHOL/HDL RATIO: 4.5 ratio (ref <5.0)
Cholesterol: 121 mg/dL (ref <200)
HDL: 27 mg/dL — ABNORMAL LOW (ref >40)
LDL CALC: 73 mg/dL (ref <100)
Triglycerides: 103 mg/dL (ref <150)
VLDL: 21 mg/dL (ref <30)

## 2016-04-10 LAB — HEMOGLOBIN A1C
HEMOGLOBIN A1C: 10.3 % — AB (ref <5.7)
MEAN PLASMA GLUCOSE: 249 mg/dL

## 2016-09-16 ENCOUNTER — Other Ambulatory Visit: Payer: Self-pay

## 2016-09-16 DIAGNOSIS — I10 Essential (primary) hypertension: Secondary | ICD-10-CM

## 2016-09-16 MED ORDER — AMLODIPINE BESYLATE 5 MG PO TABS: 5.0000 mg | ORAL_TABLET | Freq: Every day | ORAL | 0 refills | Status: DC

## 2016-09-27 ENCOUNTER — Other Ambulatory Visit: Payer: Self-pay

## 2016-09-27 MED ORDER — OMEPRAZOLE 20 MG PO CPDR: DELAYED_RELEASE_CAPSULE | ORAL | 2 refills | Status: DC

## 2016-10-15 ENCOUNTER — Telehealth: Payer: Self-pay | Admitting: Family Medicine

## 2016-10-15 DIAGNOSIS — E1100 Type 2 diabetes mellitus with hyperosmolarity without nonketotic hyperglycemic-hyperosmolar coma (NKHHC): Secondary | ICD-10-CM

## 2016-10-15 MED ORDER — GLIMEPIRIDE 4 MG PO TABS: ORAL_TABLET | ORAL | 0 refills | Status: DC

## 2016-10-15 MED ORDER — METFORMIN HCL ER 500 MG PO TB24: 500.0000 mg | ORAL_TABLET | ORAL | 12 refills | Status: DC

## 2016-10-15 NOTE — Telephone Encounter (Signed)
Pt needs refills on metformin and glimepiride sent to [facility].  Carolyn's call back [phone]

## 2016-12-10 ENCOUNTER — Ambulatory Visit: Payer: BLUE CROSS/BLUE SHIELD | Admitting: Family Medicine

## 2016-12-10 ENCOUNTER — Encounter: Payer: Self-pay | Admitting: Family Medicine

## 2016-12-10 VITALS — BP 132/61 | HR 60 | Temp 98.2°F | Resp 16 | Ht 72.0 in | Wt 223.0 lb

## 2016-12-10 DIAGNOSIS — E7849 Other hyperlipidemia: Secondary | ICD-10-CM

## 2016-12-10 DIAGNOSIS — E1165 Type 2 diabetes mellitus with hyperglycemia: Secondary | ICD-10-CM

## 2016-12-10 DIAGNOSIS — Z23 Encounter for immunization: Secondary | ICD-10-CM | POA: Diagnosis not present

## 2016-12-10 DIAGNOSIS — IMO0001 Reserved for inherently not codable concepts without codable children: Secondary | ICD-10-CM

## 2016-12-10 LAB — POCT GLYCOSYLATED HEMOGLOBIN (HGB A1C): Hemoglobin A1C: 10 — AB (ref ?–5.7)

## 2016-12-10 MED ORDER — METFORMIN HCL ER 500 MG PO TB24: 1000.0000 mg | ORAL_TABLET | Freq: Every day | ORAL | 11 refills | Status: DC

## 2016-12-10 NOTE — Assessment & Plan Note (Addendum)
Uncontrolled DM with A1c still >10 (prior 10.3 and 11) No known complications or hypoglycemia.  Plan:  1. Discussion on adjusting DM management with meds / lifestyle - START new GLP1 agent - handout given with names and preferences, he is to check ins coverage next, will make copay card available once we hear back on which med, discussed potential side effect nausea, take with meal, weekly dosing for most, benefits wt loss, BP control, cardiovascular risk reduction based on which one - DISCONTINUE Glimepiride - ADJUST Metformin - GI intolerance on XR 500 AM / 1000mg  PM - switch to Metformin XR 1000mg  AM (x 2 of 500mg  tabs), new rx printed for patient  2. Encourage improved lifestyle - low carb, low sugar diet, reduce portion size, start regular exercise. Handouts given. 3. Needs to check CBG, bring log to next visit for review 4. Continue ARB - hold Statin temporarily 5. DM Foot exam done today / Advised to schedule DM ophtho exam, send record 6. Follow-up 3 months DM A1c adjust - Recommend future Nutritionist referral

## 2016-12-10 NOTE — Patient Instructions (Addendum)
Thank you for coming to the clinic today.  1. New Metformin XR 500mg  tablets - TAKE TWO tablets in morning with food, and NONE in evening - you may finish old bottle but new rx already sent  STOP Glimepiride completely  For now HOLD Atorvastatin - do not take until future advised to do so, we will adjust sugar first and then work on cholesterol  --------------------------------------------------  Call insurance find cost and coverage of the following  1. Ozempic (Semaglutide injection) - start 0.25mg  weekly for 4 weeks then increase to 0.5mg  weekly - This one has best benefit of weight loss and reducing Cardiovascular events  2. Bydureon BCise (Exenatide ER) - once weekly - this is my 2nd  preference, very good medicine well tolerated, less side effects of nausea, upset stomach. No dose changes. Cost and coverage is the problem, but we may be able to get it with the coupon card  3. Trulicity (Dulaglutide) - once weekly - this is very good one, usually one of my top choices as well, two doses, 0.75 (likely we would start) and 1.5 max dose. We can use coupon card here too  4. Victoza (Liraglutide) - once DAILY - 3 dose changes 0.6, 1.2 and 1.8, side effects nausea, upset stomach higher on this one but it is still very effective medicine  Your provider would like to you have your annual eye exam. Please contact your current eye doctor or here are some good options for you to contact.  Or you can schedule with your previous eye Dr to have a Diabetic Eye [place]    [facility] Address: [address] [place], [place] [address]   Address: [address], [place], [place] [address]  Phone: [phone]      Phone: [phone]  Website: [email or web address]    Website: [email or web address]     [facility]  Address: [address] [place], [place], [place] [address]   Address: [place], [place], [place] [address] Phone: [phone]      Phone: [phone]    [facility] Address: [place], [place], [place] [address]  Phone: [phone]   --------------------------------- Eat at least 3 meals and 1-2 snacks per day (don't skip breakfast).  Aim for no more than 5 hours between eating. - Tip: If you go >5 hours without eating and become very hungry, your body will supply it's own resources temporarily and you can gain extra weight when you eat.  The 5 Minute Rule of Exercise - Promise yourself to at least do 5 minutes of exercise (make sure you time it), and if at the end of 5 minutes (this is the hardest part of the work-out), if you still feel like you want stop (or not motivated to continue) then allow yourself to stop. Otherwise, more often than not you will feel encouraged that you can continue for a little while longer or even more!  Diet Recommendations for Diabetes   REDUCE Starchy (carb) foods include: Bread, rice, pasta, potatoes, corn, crackers, bagels, muffins, all baked goods.   Protein foods include: Meat, fish, poultry, eggs, dairy foods, and beans such as pinto and kidney beans (beans also provide carbohydrate).   1. Eat at least 3 meals and 1-2 snacks per day. Never go more than 4-5 hours while awake without eating.   2. Limit starchy foods  to TWO per meal and ONE per snack. ONE portion of a starchy  food is equal to the following:   - ONE slice of bread (or its equivalent, such as half of a hamburger bun).   - 1/2 cup of a "scoopable" starchy food such as potatoes or rice.   - 1 OUNCE (28 grams) of starchy snacks (crackers or pretzels, look on label).   - 15 grams of carbohydrate as shown on food label.   3. Both lunch and dinner should include a protein food, a carb food, and vegetables.   - Obtain twice as many veg's as protein or carbohydrate foods for both lunch and dinner.   - Try to keep frozen veg's on hand for a quick vegetable serving.     - Fresh or frozen veg's  are best.   4. Breakfast should always include protein.    Please schedule a Follow-up Appointment to: Return in about 3 months (around 03/12/2017) for DM A1c, med adjust.  If you have any other questions or concerns, please feel free to call the clinic or send a message through [place]. You may also schedule an earlier appointment if necessary.  Additionally, you may be receiving a survey about your experience at our clinic within a few days to 1 week by e-mail or mail. We value your feedback.  Nobie Putnam, DO [place]

## 2016-12-10 NOTE — Progress Notes (Signed)
Subjective:    [name], male    DOB: 1950/08/17, 66 y.o.   MRN: [id number]  Jonathan Faulkner is a 66 y.o. male presenting on 12/10/2016 for Diabetes (highest 218 lowest 190)   HPI   CHRONIC DM, Type 2 - Uncontrolled Reports no significant concerns prior to today. He admits needs to do better with sugar. CBGs: Avg 190-200s, Low None, High < 250. Checks CBGs - occasionally in morning fasting Meds: Metformin 500mg  1.5 in AM and skip evening, Glimepiride 4mg  x2 in AM daily - Never got Saxagliptin (Onglyza medicine rx 04/2016 by prior PCP) Reports good compliance. Tolerating well w/o side-effects Currently on ARB Lifestyle: - Diet (Drinks mostly water, also does drink chocolate milk and milk shakes, ice cream, not limiting other carbs, not been to diabetic nutrition)  - Exercise (Limited) - Due for DM Eye Exam, previously followed by Dr Becky Augusta by report, he will need to schedule upcoming apt Denies hypoglycemia, polyuria, visual changes, numbness or tingling.  HYPERLIPIDEMIA: - Reports concerns with not wanting to take statin anymore, no clear reason or side effect - Last lipid panel 04/2016, mostly controlled  - Currently taking Atorvastatin 20mg , tolerating well without side effects or myalgias  CHRONIC HTN: Reports no new concerns. Checks BP occasionally normal readings. Current Meds - Losartan 100mg    Reports good compliance, took meds today. Tolerating well, w/o complaints. Denies CP, dyspnea, HA, edema, dizziness / lightheadedness  Health Maintenance: - Due for Flu Shot, will receive today   Depression screen [facility] 2/9 12/10/2016 10/05/2015 03/07/2015  Decreased Interest 0 0 0  Down, Depressed, Hopeless 0 0 0  PHQ - 2 Score 0 0 0    Social History   Tobacco Use  . Smoking status: Current Every Day Smoker    Years: 47.00    Types: Cigars  . Smokeless tobacco: Never Used  . Tobacco comment: 3-4 day  Substance Use Topics  . Alcohol use: No  .  Drug use: No    Review of Systems Per HPI unless specifically indicated above     Objective:    BP 132/61 (BP Location: Left Arm, Patient Position: Sitting, Cuff Size: Normal)   Pulse 60   Temp 98.2 F (36.8 C) (Oral)   Resp 16   Ht 6' (1.829 m)   Wt 223 lb (101.2 kg)   BMI 30.24 kg/m   Wt Readings from Last 3 Encounters:  12/10/16 223 lb (101.2 kg)  04/09/16 226 lb (102.5 kg)  01/02/16 223 lb (101.2 kg)    Physical Exam  Constitutional: He appears well-developed and well-nourished. No distress.  Well-appearing, comfortable, cooperative, overweight  HENT:  Head: Normocephalic and atraumatic.  Mouth/Throat: Oropharynx is clear and moist.  Eyes: Conjunctivae are normal. Right eye exhibits no discharge. Left eye exhibits no discharge.  Neck: Normal range of motion. Neck supple. No thyromegaly present.  Cardiovascular: Normal rate, regular rhythm, normal heart sounds and intact distal pulses.  No murmur heard. Pulmonary/Chest: Effort normal and breath sounds normal. No respiratory distress. He has no wheezes. He has no rales.  Musculoskeletal: Normal range of motion. He exhibits no edema.  Lymphadenopathy:    He has no cervical adenopathy.  Neurological: He is alert.  Skin: Skin is warm and dry. No rash noted. He is not diaphoretic. No erythema.  Psychiatric: His behavior is normal.  Nursing note and vitals reviewed.     Diabetic Foot Exam - Simple   Simple Foot Form Diabetic Foot exam  was performed with the following findings:  Yes 12/10/2016 10:18 AM  Visual Inspection See comments:  Yes Sensation Testing Intact to touch and monofilament testing bilaterally:  Yes Pulse Check Posterior Tibialis and Dorsalis pulse intact bilaterally:  Yes Comments Left foot heel dry callus     Results for orders placed or performed in visit on 12/10/16  POCT HgB A1C  Result Value Ref Range   Hemoglobin A1C 10.0 (A) 5.7      Assessment & Plan:   Problem List Items Addressed  This Visit    Hyperlipidemia    Not focus of visit today Previously statin controlled cholesterol, last 04/2016 Concern with possible side effects had history of some myalgias in past, unclear if still symptoms Agree to hold statin for now while control DM with new medicines Revisit in future      Uncontrolled type 2 diabetes mellitus without complication ([place]) - Primary    Uncontrolled DM with A1c still >10 (prior 10.3 and 11) No known complications or hypoglycemia.  Plan:  1. Discussion on adjusting DM management with meds / lifestyle - START new GLP1 agent - handout given with names and preferences, he is to check ins coverage next, will make copay card available once we hear back on which med, discussed potential side effect nausea, take with meal, weekly dosing for most, benefits wt loss, BP control, cardiovascular risk reduction based on which one - DISCONTINUE Glimepiride - ADJUST Metformin - GI intolerance on XR 500 AM / 1000mg  PM - switch to Metformin XR 1000mg  AM (x 2 of 500mg  tabs), new rx printed for patient  2. Encourage improved lifestyle - low carb, low sugar diet, reduce portion size, start regular exercise. Handouts given. 3. Needs to check CBG, bring log to next visit for review 4. Continue ARB - hold Statin temporarily 5. DM Foot exam done today / Advised to schedule DM ophtho exam, send record 6. Follow-up 3 months DM A1c adjust - Recommend future Nutritionist referral      Relevant Medications   metFORMIN (GLUCOPHAGE-XR) 500 MG 24 hr tablet   Other Relevant Orders   POCT HgB A1C (Completed)    Other Visit Diagnoses    Needs flu shot       Relevant Orders   Flu Vaccine QUAD 36+ mos IM (Completed)      Meds ordered this encounter  Medications  . metFORMIN (GLUCOPHAGE-XR) 500 MG 24 hr tablet    Sig: Take 2 tablets (1,000 mg total) daily with breakfast by mouth.    Dispense:  60 tablet    Refill:  11    Follow up plan: Return in about 3 months (around  03/12/2017) for DM A1c, med adjust.  Nobie Putnam, DO [facility] 12/10/2016, 9:58 PM

## 2016-12-10 NOTE — Assessment & Plan Note (Signed)
Not focus of visit today Previously statin controlled cholesterol, last 04/2016 Concern with possible side effects had history of some myalgias in past, unclear if still symptoms Agree to hold statin for now while control DM with new medicines Revisit in future

## 2016-12-30 ENCOUNTER — Other Ambulatory Visit: Payer: Self-pay

## 2016-12-30 DIAGNOSIS — I10 Essential (primary) hypertension: Secondary | ICD-10-CM

## 2016-12-30 MED ORDER — AMLODIPINE BESYLATE 5 MG PO TABS: 5.0000 mg | ORAL_TABLET | Freq: Every day | ORAL | 0 refills | Status: DC

## 2016-12-31 ENCOUNTER — Other Ambulatory Visit: Payer: Self-pay

## 2016-12-31 MED ORDER — LOSARTAN POTASSIUM 100 MG PO TABS: ORAL_TABLET | ORAL | 3 refills | Status: DC

## 2017-01-01 ENCOUNTER — Other Ambulatory Visit: Payer: Self-pay

## 2017-01-01 DIAGNOSIS — I10 Essential (primary) hypertension: Secondary | ICD-10-CM

## 2017-01-01 MED ORDER — AMLODIPINE BESYLATE 5 MG PO TABS: 5.0000 mg | ORAL_TABLET | Freq: Every day | ORAL | 1 refills | Status: DC

## 2017-02-06 ENCOUNTER — Other Ambulatory Visit: Payer: Self-pay

## 2017-02-06 MED ORDER — NAPROXEN 500 MG PO TABS: ORAL_TABLET | ORAL | 3 refills | Status: DC

## 2017-03-25 ENCOUNTER — Other Ambulatory Visit: Payer: Self-pay | Admitting: Family Medicine

## 2017-06-16 ENCOUNTER — Ambulatory Visit: Payer: BLUE CROSS/BLUE SHIELD | Admitting: Family Medicine

## 2017-06-16 ENCOUNTER — Encounter: Payer: Self-pay | Admitting: Family Medicine

## 2017-06-16 VITALS — BP 107/59 | HR 79 | Temp 98.2°F | Resp 16 | Ht 72.0 in | Wt 205.0 lb

## 2017-06-16 DIAGNOSIS — N390 Urinary tract infection, site not specified: Secondary | ICD-10-CM

## 2017-06-16 DIAGNOSIS — IMO0001 Reserved for inherently not codable concepts without codable children: Secondary | ICD-10-CM

## 2017-06-16 DIAGNOSIS — R319 Hematuria, unspecified: Secondary | ICD-10-CM

## 2017-06-16 DIAGNOSIS — E1165 Type 2 diabetes mellitus with hyperglycemia: Secondary | ICD-10-CM

## 2017-06-16 LAB — POCT URINALYSIS DIPSTICK
Bilirubin, UA: NEGATIVE
GLUCOSE UA: 2000
LEUKOCYTES UA: NEGATIVE
Nitrite, UA: NEGATIVE
PROTEIN UA: 2000
Urobilinogen, UA: 0.2 E.U./dL
pH, UA: 5 (ref 5.0–8.0)

## 2017-06-16 MED ORDER — CEPHALEXIN 500 MG PO CAPS: 500.0000 mg | ORAL_CAPSULE | Freq: Three times a day (TID) | ORAL | 0 refills | Status: DC

## 2017-06-16 NOTE — Patient Instructions (Addendum)
Thank you for coming to the office today.  1. You have a Urinary Tract Infection - this is very common, your symptoms are reassuring and you should get better within 1 week on the antibiotics - Start Keflex 500mg  3 times daily for next 7 days, complete entire course, even if feeling better - We sent urine for a culture, we will call you within next few days if we need to change antibiotics - Please drink plenty of fluids, improve hydration over next 1 week  If symptoms worsening, developing nausea / vomiting, worsening back pain, fevers / chills / sweats, then please return for re-evaluation sooner.  To prevent bladder and kidney infections...  1. Pee within 30 minutes after sex  2. Wipe front to back after using the restroom  3. Drink enough water to keep your pee clear to pale yellow  If you think you are getting another bladder infection, start drinking cranberry juice and come see [place] so we can check the urine.   Please schedule a Follow-up Appointment to: Return in about 1 month (around 07/14/2017) for Diabetes A1c,  med adjust.  If you have any other questions or concerns, please feel free to call the office or send a message through [place]. You may also schedule an earlier appointment if necessary.  Additionally, you may be receiving a survey about your experience at our office within a few days to 1 week by e-mail or mail. We value your feedback.  Nobie Putnam, DO [place]

## 2017-06-16 NOTE — Progress Notes (Signed)
Subjective:    Patient ID: LUISDAVID HAMBLIN, male    DOB: 1950-12-20, 67 y.o.   MRN: [id number]  ERIVERTO BYRNES is a 67 y.o. male presenting on 06/16/2017 for Urinary Tract Infection (burning, cloudy urine onset 3 days)  Patient presents for a same day appointment.  HPI   UTI / Dysuria / Frequency Reports symptoms started 2-3 days ago with acute onset with burning with urination, episode some voids are worse than others, he tried pure cranberry juice with some possible relief, but still has persistent episodic symptoms with dysuria. Not improving overall. He has never had UTI before. Questionable if he had gross hematuria he describes some darker or red coloration to urine, without any clots. - Admits increased urgency to void, especially if standing up from seated position - Admits nausea without vomiting - Denies fever, chills, sweats, diarrhea, abdominal pain, flank pain or back pain  PMH - Type 2 Diabetes, uncontrolled, last A1c >10 (11/2016), he was given advice to start GLP1, but he looked into this online and was worried with what he read and did not want to pursue this, tried to improve diet exercise and lifestyle, has had some weight loss. He is overdue for diabetes follow-up and will return for this.   Depression screen [facility] 2/9 12/10/2016 10/05/2015 03/07/2015  Decreased Interest 0 0 0  Down, Depressed, Hopeless 0 0 0  PHQ - 2 Score 0 0 0    Social History   Tobacco Use  . Smoking status: Current Every Day Smoker    Years: 47.00    Types: Cigars  . Smokeless tobacco: Never Used  . Tobacco comment: 3-4 day  Substance Use Topics  . Alcohol use: No  . Drug use: No    Review of Systems Per HPI unless specifically indicated above     Objective:    BP (!) 107/59   Pulse 79   Temp 98.2 F (36.8 C) (Oral)   Resp 16   Ht 6' (1.829 m)   Wt 205 lb (93 kg)   BMI 27.80 kg/m   Wt Readings from Last 3 Encounters:  06/16/17 205 lb (93 kg)  12/10/16 223 lb (101.2 kg)   04/09/16 226 lb (102.5 kg)    Physical Exam  Constitutional: He is oriented to person, place, and time. He appears well-developed and well-nourished. No distress.  Well-appearing, comfortable, cooperative  HENT:  Head: Normocephalic and atraumatic.  Mouth/Throat: Oropharynx is clear and moist.  Eyes: Conjunctivae are normal. Right eye exhibits no discharge. Left eye exhibits no discharge.  Neck: Normal range of motion. Neck supple. No thyromegaly present.  Cardiovascular: Normal rate, regular rhythm, normal heart sounds and intact distal pulses.  No murmur heard. Pulmonary/Chest: Effort normal and breath sounds normal. No respiratory distress. He has no wheezes. He has no rales.  Abdominal: Soft. Bowel sounds are normal. He exhibits no distension and no mass. There is no tenderness. There is no rebound and no guarding.  Musculoskeletal: Normal range of motion. He exhibits no edema.  No CVAT  Lymphadenopathy:    He has no cervical adenopathy.  Neurological: He is alert and oriented to person, place, and time.  Skin: Skin is warm and dry. No rash noted. He is not diaphoretic. No erythema.  Psychiatric: He has a normal mood and affect. His behavior is normal.  Well groomed, good eye contact, normal speech and thoughts  Nursing note and vitals reviewed.  Results for orders placed or performed in visit on  06/16/17  POCT Urinalysis Dipstick  Result Value Ref Range   Color, UA dark amber    Clarity, UA cloudy    Glucose, UA 2,000    Bilirubin, UA negative    Ketones, UA small    Spec Grav, UA >=1.030 (A) 1.010 - 1.025   Blood, UA large    pH, UA 5.0 5.0 - 8.0   Protein, UA 2,000    Urobilinogen, UA 0.2 0.2 or 1.0 E.U./dL   Nitrite, UA negative    Leukocytes, UA Negative Negative   Appearance cloudy    Odor none       Assessment & Plan:   Problem List Items Addressed This Visit    Uncontrolled type 2 diabetes mellitus without complication ([place]) Overdue for DM visit for  updated A1c result, last >10 uncontrolled Did not adhere to recommendation for pursue GLP1 medication, he was concerned about potential side effects and reading reviews online Improving lifestyle diet and exercise with some notable weight loss Follow-up 2-4 weeks for DM A1c, discuss med options - now less likely to pursue SGLT2 med due to current UTI     Other Visit Diagnoses    Urinary tract infection with hematuria, site unspecified    -  Primary  Clinically consistent with UTI and suggestive UA also with mild dehydration with high spec grav as he has reduced water intake due to frequency. - No prior UTI.  Risk factor, known DM2 (uncontrol) - No concern for pyelo today (no systemic symptoms, neg fever, back pain, vomiting).  Plan: 1. UA concern for possible UTI - and microscopic vs gross hematuria, uncertain at this time 2. Ordered Urine culture 3. Keflex 500mg  TID x 7 days 4. Improve PO hydration 5. RTC if no improvement 1-2 weeks, red flags given to return sooner     Relevant Medications   cephALEXin (KEFLEX) 500 MG capsule   Other Relevant Orders   POCT Urinalysis Dipstick (Completed)   CULTURE, URINE COMPREHENSIVE        Meds ordered this encounter  Medications  . cephALEXin (KEFLEX) 500 MG capsule    Sig: Take 1 capsule (500 mg total) by mouth 3 (three) times daily. For 7 days    Dispense:  21 capsule    Refill:  0    Follow up plan: Return in about 1 month (around 07/14/2017) for Diabetes A1c,  med adjust.  After upcoming DM visit then we will schedule him for an Annual Physical with fasting lab tests as requested, including screening for instance PSA, last done 04/2016. But he did not follow-up as advised from last year and is now overdue.  Nobie Putnam, [facility] 06/16/2017, 11:24 AM

## 2017-06-19 LAB — CULTURE, URINE COMPREHENSIVE
MICRO NUMBER: 90580016
SPECIMEN QUALITY:: ADEQUATE

## 2017-07-15 ENCOUNTER — Ambulatory Visit: Payer: BLUE CROSS/BLUE SHIELD | Admitting: Family Medicine

## 2017-08-19 ENCOUNTER — Other Ambulatory Visit: Payer: Self-pay | Admitting: Family Medicine

## 2017-08-19 DIAGNOSIS — K219 Gastro-esophageal reflux disease without esophagitis: Secondary | ICD-10-CM

## 2017-09-18 ENCOUNTER — Other Ambulatory Visit: Payer: Self-pay | Admitting: Family Medicine

## 2017-09-18 DIAGNOSIS — I10 Essential (primary) hypertension: Secondary | ICD-10-CM

## 2017-11-18 DIAGNOSIS — H35722 Serous detachment of retinal pigment epithelium, left eye: Secondary | ICD-10-CM | POA: Diagnosis not present

## 2017-11-18 DIAGNOSIS — H524 Presbyopia: Secondary | ICD-10-CM | POA: Diagnosis not present

## 2017-11-18 DIAGNOSIS — H5203 Hypermetropia, bilateral: Secondary | ICD-10-CM | POA: Diagnosis not present

## 2017-11-18 DIAGNOSIS — H52223 Regular astigmatism, bilateral: Secondary | ICD-10-CM | POA: Diagnosis not present

## 2017-11-22 LAB — HM DIABETES EYE EXAM

## 2017-11-27 DIAGNOSIS — H353111 Nonexudative age-related macular degeneration, right eye, early dry stage: Secondary | ICD-10-CM | POA: Diagnosis not present

## 2017-11-27 DIAGNOSIS — H35033 Hypertensive retinopathy, bilateral: Secondary | ICD-10-CM | POA: Diagnosis not present

## 2017-11-27 DIAGNOSIS — H353122 Nonexudative age-related macular degeneration, left eye, intermediate dry stage: Secondary | ICD-10-CM | POA: Diagnosis not present

## 2017-11-27 DIAGNOSIS — H35411 Lattice degeneration of retina, right eye: Secondary | ICD-10-CM | POA: Diagnosis not present

## 2017-12-01 ENCOUNTER — Other Ambulatory Visit: Payer: Self-pay | Admitting: Family Medicine

## 2017-12-10 ENCOUNTER — Other Ambulatory Visit: Payer: Self-pay | Admitting: Family Medicine

## 2017-12-10 DIAGNOSIS — K219 Gastro-esophageal reflux disease without esophagitis: Secondary | ICD-10-CM

## 2017-12-15 ENCOUNTER — Other Ambulatory Visit: Payer: Self-pay | Admitting: Family Medicine

## 2017-12-15 DIAGNOSIS — I10 Essential (primary) hypertension: Secondary | ICD-10-CM

## 2017-12-16 ENCOUNTER — Ambulatory Visit (INDEPENDENT_AMBULATORY_CARE_PROVIDER_SITE_OTHER): Payer: PPO | Admitting: Family Medicine

## 2017-12-16 ENCOUNTER — Encounter: Payer: Self-pay | Admitting: Family Medicine

## 2017-12-16 ENCOUNTER — Ambulatory Visit
Admission: RE | Admit: 2017-12-16 | Discharge: 2017-12-16 | Disposition: A | Discharge status: Home / Self Care | Payer: PPO | Source: Ambulatory Visit | Attending: Family Medicine | Admitting: Family Medicine

## 2017-12-16 VITALS — BP 156/76 | HR 62 | Temp 98.2°F | Resp 16 | Ht 72.0 in | Wt 213.0 lb

## 2017-12-16 DIAGNOSIS — E1165 Type 2 diabetes mellitus with hyperglycemia: Secondary | ICD-10-CM

## 2017-12-16 DIAGNOSIS — Z23 Encounter for immunization: Secondary | ICD-10-CM

## 2017-12-16 DIAGNOSIS — IMO0001 Reserved for inherently not codable concepts without codable children: Secondary | ICD-10-CM

## 2017-12-16 DIAGNOSIS — M25511 Pain in right shoulder: Secondary | ICD-10-CM

## 2017-12-16 DIAGNOSIS — S4992XA Unspecified injury of left shoulder and upper arm, initial encounter: Secondary | ICD-10-CM | POA: Diagnosis not present

## 2017-12-16 NOTE — Progress Notes (Addendum)
Subjective:    [name], male    DOB: 13-Apr-1950, 67 y.o.   MRN: [id number]  Jonathan Faulkner is a 67 y.o. male presenting on 12/16/2017 for Fall (shoulder pain onset 5 days right side )   HPI   RIGHT SHOULDER PAIN Reports accidental fall 4-5 days ago on Friday, he was loading tractor, and slipped on mud and fell and landed directly on R shoulder on an asphalt gravel, he stated he had pain and a "pop" when he hit ground. Initially difficulty moving R upper ext but could move it with his other hand. He rested for a few days and seems to gradually improve, now without significant pain at rest if not using arm, but has significant pain and weakness with lifting his R arm above shoulder, limited ability to do this, can help lift his R arm with his L arm without significant pain. - Tried some Deep Blue cream in R shoulder with occasional benefits for past several days for twice a day, he has used it on back before - Recently restarted Naproxen 500mg  twice a day - No prior R shoulder injury or upper ext injury before. No prior surgery - Wake up with pain in R shoulder at night, feel better sleep elevated - Admits very rare will feel a few fingers feel tingling without significant numbness, episodic - Denies any new injury or trauma, other joint or injury, worsening numbness tingling in hand or arm, swelling or redness  Type 2 Diabetes, uncontrolled Previously was seen about 1 year ago 12/2016 for DM, he had uncontrolled A1c >10, he did not adhere to follow-up schedule, last seen 06/2017 but again did not keep follow-up schedule as planned. - Today not focus of visit for DM, but acknowledges he needs to check on and control sugars - He had Eye Exam recently from Dr Matilde Sprang, showed no DM retinopathy, this was entered into system - He is due for foot exam, agrees to get today - Admits occasional toes feeling cold - Admits some slight discoloration lower leg bilateral, with spider  veins - Denies numbness tingling weakness, redness swelling  Health Maintenance: Due for High Dose Flu Shot, will receive today   Due for initial pneumonia vaccine at age 27 - defer today by preference, will return in 3-4 weeks and then will receive Prevnar-13  Depression screen [facility] 2/9 12/16/2017 12/10/2016 10/05/2015  Decreased Interest 0 0 0  Down, Depressed, Hopeless 0 0 0  PHQ - 2 Score 0 0 0    Social History   Tobacco Use  . Smoking status: Current Every Day Smoker    Years: 47.00    Types: Cigars  . Smokeless tobacco: Current User  . Tobacco comment: 3-4 day  Substance Use Topics  . Alcohol use: No  . Drug use: No    Review of Systems Per HPI unless specifically indicated above     Objective:    BP (!) 156/76   Pulse 62   Temp 98.2 F (36.8 C) (Oral)   Resp 16   Ht 6' (1.829 m)   Wt 213 lb (96.6 kg)   BMI 28.89 kg/m   Wt Readings from Last 3 Encounters:  12/16/17 213 lb (96.6 kg)  06/16/17 205 lb (93 kg)  12/10/16 223 lb (101.2 kg)    Physical Exam  Constitutional: He is oriented to person, place, and time. He appears well-developed and well-nourished. No distress.  Well-appearing, comfortable, cooperative  HENT:  Head:  Normocephalic and atraumatic.  Mouth/Throat: Oropharynx is clear and moist.  Eyes: Conjunctivae are normal. Right eye exhibits no discharge. Left eye exhibits no discharge.  Cardiovascular: Normal rate.  Pulmonary/Chest: Effort normal.  Musculoskeletal: He exhibits no edema.  Right Shoulder Inspection: Normal appearance bilateral symmetrical - no deformity Palpation: Localized tender to palpation over anterior shoulder only, near biceps tendon ROM: Dramatically reduced forward flexion, unable to really lift off, but he can lift R arm using his other arm without pain to and above shoulder level passively ROM - He has mostly preserved internal range of motion for R shoulder behind back - He has some partial intact ROM of R abduction  away from body still only 50% intact, cannot lift above shoulder  L Shoulder full ROM intact  Special Testing: Rotator cuff testing is limited due to arm position, cannot get full testing, but isolated supraspinatus testing seems to show focal pain with weakness. Impingement testing is also POSITIVE R shoulder  Strength: Normal strength 5/5 flex/ext, ext rot / int rot, grip, biceps strength intact. Unable to fully test rotator cuff  Neurovascular: Distally intact pulses, sensation to light touch   Neurological: He is alert and oriented to person, place, and time.  Skin: Skin is warm and dry. No rash noted. He is not diaphoretic. No erythema.  Abrasion to R elbow, scab, healing  Psychiatric: He has a normal mood and affect. His behavior is normal.  Well groomed, good eye contact, normal speech and thoughts  Nursing note and vitals reviewed.  I have personally reviewed the radiology report from R Shoulder X-ray on 12/16/17.   CLINICAL DATA: Fell on shoulder. Pain  EXAM: RIGHT SHOULDER - 2+ VIEW  COMPARISON: None.  FINDINGS: There is no evidence of fracture or dislocation. There is no evidence of arthropathy or other focal bone abnormality. Soft tissues are unremarkable.  IMPRESSION: Negative.   Electronically Signed By: Franchot Gallo M.D. On: 12/16/2017 15:31  Diabetic Foot Exam - Simple   Simple Foot Form Diabetic Foot exam was performed with the following findings:  Yes 12/16/2017 10:39 AM  Visual Inspection See comments:  Yes Sensation Testing Intact to touch and monofilament testing bilaterally:  Yes Pulse Check Posterior Tibialis and Dorsalis pulse intact bilaterally:  Yes Comments Bilateral feet with callus formation heels, dry skin, no other deformity, with varicose and spider veins. Intact monofilament testing.     Results for orders placed or performed in visit on 12/04/17  HM DIABETES EYE EXAM  Result Value Ref Range   HM Diabetic Eye Exam No  Retinopathy No Retinopathy      Assessment & Plan:   Problem List Items Addressed This Visit    Uncontrolled type 2 diabetes mellitus without complication ([place]) Again significant concern with uncontrolled DM, non adherent to follow-up recommendations - UTD DM Eye exam - Checked Foot Exam today, reassuring  Advised he needs to schedule f/u for DM visit within 3-4 weeks, will check A1c at that time as well, will review metformin dosing, and again review GLP1 agents, as he was offered this in past but did not initiate therapy - Ultimately may consider Endocrinology referral if remains uncontrolled     Other Visit Diagnoses    Acute pain of right shoulder    -  Primary   Relevant Orders   DG Shoulder Right   Needs flu shot       Relevant Orders   Flu vaccine HIGH DOSE PF (Completed)      Consistent with  acute R shoulder traumatic injury with pain and weakness, primarily isolated with forward flex / rotator cuff supraspinatus - Question if possible dislocation injury and then relocation spontaneous after - Seems to have gradual improving active ROM R shoulder and function in past few days, but still significantly limited mobility - Possible R shoulder contusion/sprain and some bursitis - No prior djd or OA or shoulder problem before No imaging on chart  Plan: - Check R Shoulder X-ray for structural eval / rule out fracture - follow-up result  1. Start rx Naproxen 500mg  twice daily (with food) for 2 weeks, then as needed - already has rx at home 2. May take Tylenol Ex Str 1-2 q 6 hr PRN 3. Relative rest but keep shoulder mobile, demonstrated ROM exercises, avoid heavy lifting 4. May continue heating pad PRN 5. Follow-up 3-4 weeks if not improved for re-evaluation, consider referral to Physical Therapy, and or subacromial steroid injection vs Orthopedics - offered PT today but he declined   No orders of the defined types were placed in this encounter.   Follow up plan: Return in  about 4 weeks (around 01/13/2018) for DM A1c, f/u R shoulder injury - Prevnar-13 PNA Vaccine (age 51).   Nobie Putnam, DO [facility] 12/16/2017, 11:24 AM   **UPDATE after clinic 530pm 12/16/17 - reviewed R Shoulder X-ray - results are NEGATIVE, no fracture, dislocation or arthritis, sent results to patient on mychart, staff to call patient tomorrow, continue current plan, follow-up as advised and if not improved by next visit we can consider referral to ortho if need, may benefit from MRI if concern rotator cuff muscle injury**

## 2017-12-16 NOTE — Patient Instructions (Addendum)
Thank you for coming to the office today.  We will check X-ray today - stay tuned, rule out fracture  Goal to avoid frozen shoulder   Try to keep active  Most likely you have a shoulder rotator cuff strain vs bursitis of your shoulder. This is inflammation of the shoulder joint caused most often by arthritis or wear and tear. Often it can flare up to cause bursitis due to repetitive activities or other triggers. It may take time to heal, possibly 2 to 6 weeks, and it is important to avoid over use of shoulder especially above head motions that can re-aggravate the problem.  Recommend trial of Anti-inflammatory with Naproxen (Naprosyn) 500mg  tabs - take one with food and plenty of water TWICE daily every day (breakfast and dinner), for next 2 to 4 weeks, then you may take only as needed  - DO NOT TAKE any ibuprofen, aleve, motrin while you are taking this medicine  Recommend to start taking Tylenol Extra Strength 500mg  tabs - take 1 to 2 tabs per dose (max 1000mg ) every 6-8 hours for pain (take regularly, don't skip a dose for next 7 days), max 24 hour daily dose is 6 tablets or 3000mg . In the future you can repeat the same everyday Tylenol course for 1-2 weeks at a time.   If not improving as expected or keeps waking you up - call our office 1-2 weeks - we can add a muscle relaxant to help sleep.  Also we can refer you to Physical Therapy if just need to improve on range of motion.  Lastly if not improved by about 3=4 weeks - or just need treatment sooner - call and return for a Steroid Injection in shoulder.  Please schedule a Follow-up Appointment to: Return in about 4 weeks (around 01/13/2018) for DM A1c, f/u R shoulder injury - Prevnar-13 PNA Vaccine (age 67).  If you have any other questions or concerns, please feel free to call the office or send a message through [place]. You may also schedule an earlier appointment if necessary.  Additionally, you may be receiving a survey about your  experience at our office within a few days to 1 week by e-mail or mail. We value your feedback.  Nobie Putnam, DO [facility], [facility]  Range of Motion Shoulder Exercises  Rosa with your good arm against a counter or table for support [facility] forward with a wide stance (make sure your body is comfortable) - Your painful shoulder should hang down and feel "heavy" - Gently move your painful arm in small circles "clockwise" for several turns - Switch to "counterclockwise" for several turns - Early on keep circles narrow and move slowly - Later in rehab, move in larger circles and faster movement   Wall Crawl - Stand close (about 1-2 ft away) to a wall, facing it directly - Reach out with your arm of painful shoulder and place fingers (not palm) on wall - You should make contact with wall at your waist level - Slowly walk your fingers up the wall. Stay in contact with wall entire time, do not remove fingers - Keep walking fingers up wall until you reach shoulder level - You may feel tightening or mild discomfort, once you reach a height that causes pain or if you are already above your shoulder height then stop. Repeat from starting position. - Early on stand closer to wall, move fingers slowly, and stay at or below shoulder level -  Later in rehab, stand farther away from wall (fingertips), move fingers quicker, go above shoulder level

## 2017-12-18 ENCOUNTER — Other Ambulatory Visit: Payer: Self-pay | Admitting: Family Medicine

## 2017-12-18 DIAGNOSIS — E1165 Type 2 diabetes mellitus with hyperglycemia: Principal | ICD-10-CM

## 2017-12-18 DIAGNOSIS — IMO0001 Reserved for inherently not codable concepts without codable children: Secondary | ICD-10-CM

## 2017-12-30 ENCOUNTER — Other Ambulatory Visit: Payer: Self-pay | Admitting: Family Medicine

## 2018-01-14 ENCOUNTER — Other Ambulatory Visit: Payer: Self-pay | Admitting: Family Medicine

## 2018-01-14 ENCOUNTER — Encounter: Payer: Self-pay | Admitting: Family Medicine

## 2018-01-14 ENCOUNTER — Ambulatory Visit (INDEPENDENT_AMBULATORY_CARE_PROVIDER_SITE_OTHER): Payer: PPO | Admitting: Family Medicine

## 2018-01-14 VITALS — BP 147/70 | HR 65 | Temp 98.2°F | Resp 16 | Ht 72.0 in | Wt 211.6 lb

## 2018-01-14 DIAGNOSIS — E1169 Type 2 diabetes mellitus with other specified complication: Secondary | ICD-10-CM

## 2018-01-14 DIAGNOSIS — IMO0001 Reserved for inherently not codable concepts without codable children: Secondary | ICD-10-CM

## 2018-01-14 DIAGNOSIS — E1165 Type 2 diabetes mellitus with hyperglycemia: Principal | ICD-10-CM

## 2018-01-14 DIAGNOSIS — M19011 Primary osteoarthritis, right shoulder: Secondary | ICD-10-CM

## 2018-01-14 DIAGNOSIS — Z23 Encounter for immunization: Secondary | ICD-10-CM

## 2018-01-14 LAB — POCT GLYCOSYLATED HEMOGLOBIN (HGB A1C): Hemoglobin A1C: 11 % — AB (ref 4.0–5.6)

## 2018-01-14 NOTE — Assessment & Plan Note (Signed)
Uncontrolled DM with A1c still >10 now up to 11, with hyperglycemia. Complications - other including hyperlipidemia - increases risk of future cardiovascular complications   Plan:  1. Discussion on adjusting DM management with meds / lifestyle - Again emphasized importance of starting new med such as GLP1 agent - would be best for him for adherence, efficacy, and given he has some lower extremity symptoms would prefer to avoid SGLT2 - AGAIN handout given with names and preferences, he is to check ins coverage next, will make copay card available once we hear back on which med, discussed potential side effect nausea, take with meal, weekly dosing for most, benefits wt loss, BP control, cardiovascular risk reduction based on which one - Continue Metformin XR 500mg  x 2 daily 2. Encourage improved lifestyle - low carb, low sugar diet, reduce portion size, start regular exercise. Handouts given. 3. Needs to check CBG, bring log to next visit for review 4. Continue ARB, Statin - UTD DM Eye Dr Matilde Sprang / DM Foot 5. Follow-up 3 months DM A1c adjust - Recommend future Nutritionist referral - he declines again

## 2018-01-14 NOTE — Patient Instructions (Addendum)
Thank you for coming to the office today.  Recent Labs    01/14/18 1614  HGBA1C 11.0*   Call insurance find cost and coverage of the following  1. Ozempic (Semaglutide injection) - start 0.25mg  weekly for 4 weeks then increase to 0.5mg  weekly - This one has best benefit of weight loss and reducing Cardiovascular events  2. Bydureon BCise (Exenatide ER) - once weekly - this is my preference, very good medicine well tolerated, less side effects of nausea, upset stomach. No dose changes. Cost and coverage is the problem, but we may be able to get it with the coupon card  3. Trulicity (Dulaglutide) - once weekly - this is very good one, usually one of my top choices as well, two doses, 0.75 (likely we would start) and 1.5 max dose. We can use coupon card here too  4. Victoza (Liraglutide) - once DAILY - 3 dose changes 0.6, 1.2 and 1.8, side effects nausea, upset stomach higher on this one but it is still very effective medicine  --------------------------------------------------  Let me know if interested to talk with Nutritionist at [facility]  ----------------------------  Continue with shoulder exercises and progress - if not improving by 1-2 more months then let me know and we can consider injection or physical therapy.   Please schedule a Follow-up Appointment to: Return in about 3 months (around 04/15/2018) for DM A1c.  If you have any other questions or concerns, please feel free to call the office or send a message through [place]. You may also schedule an earlier appointment if necessary.  Additionally, you may be receiving a survey about your experience at our office within a few days to 1 week by e-mail or mail. We value your feedback.  Nobie Putnam, DO [place]

## 2018-01-14 NOTE — Progress Notes (Signed)
Subjective:    [name], male    DOB: 1950-07-10, 67 y.o.   MRN: [id number]  Jonathan Faulkner is a 67 y.o. male presenting on 01/14/2018 for Diabetes   HPI   FOLLOW-UP RIGHT SHOULDER PAIN - Last visit with me 12/16/17, for initial visit for this problem, dx with R rotator cuff strain, treated with Naproxen, Tylenol, rest, ROM exercises, X-ray that showed no significant DJD arthritis, see prior notes for background information. - Interval update with he did notice a "pop" at times and feels like shoulder would feel back in place or improved - Today patient reports overall he is 70% improved from last visit 1 month ago. He thinks continued improvement. - Taking Naproxen PRN - He is using Deep Blue topical rub still with good results - He is not interested in injection or PT refer - Last visit had X-ray, unremarkable - Denies any new injury or trauma, other joint or injury, worsening numbness tingling in hand or arm, swelling or redness  Type 2 Diabetes, uncontrolled - Last visit with me 12/16/17, for same problem previously he had been lost to follow-up almost a year for diabetes, treated with continued med and return within few weeks, see prior notes for background information. - Today patient reports concern with high sugar, A1c up to 11, has been uncontrolled for a while - Meds: He takes Metformin XR x 2 daily - He had Eye Exam recently from Dr Matilde Sprang, showed no DM retinopathy, this was entered into system - Admits some slight discoloration lower leg bilateral, with spider veins - Denies numbness tingling weakness, redness swelling  Health Maintenance:  Due Prevnar-13 pneumonia vaccine, age 13+ - 1st dose, then return 1 year for Pneumovax-23  Depression screen [facility] 2/9 01/14/2018 12/16/2017 12/10/2016  Decreased Interest 0 0 0  Down, Depressed, Hopeless 0 0 0  PHQ - 2 Score 0 0 0    Social History   Tobacco Use  . Smoking status: Current Every Day Smoker      Years: 47.00    Types: Cigars  . Smokeless tobacco: Current User  . Tobacco comment: 3-4 day  Substance Use Topics  . Alcohol use: No  . Drug use: No    Review of Systems Per HPI unless specifically indicated above     Objective:    BP (!) 147/70   Pulse 65   Temp 98.2 F (36.8 C) (Oral)   Resp 16   Ht 6' (1.829 m)   Wt 211 lb 9.6 oz (96 kg)   BMI 28.70 kg/m   Wt Readings from Last 3 Encounters:  01/14/18 211 lb 9.6 oz (96 kg)  12/16/17 213 lb (96.6 kg)  06/16/17 205 lb (93 kg)    Physical Exam  Constitutional: He is oriented to person, place, and time. He appears well-developed and well-nourished. No distress.  Well-appearing, comfortable, cooperative  HENT:  Head: Normocephalic and atraumatic.  Mouth/Throat: Oropharynx is clear and moist.  Eyes: Conjunctivae are normal. Right eye exhibits no discharge. Left eye exhibits no discharge.  Cardiovascular: Normal rate.  Pulmonary/Chest: Effort normal.  Musculoskeletal: He exhibits no edema.  Neurological: He is alert and oriented to person, place, and time.  Skin: Skin is warm and dry. No rash noted. He is not diaphoretic. No erythema.  Psychiatric: He has a normal mood and affect. His behavior is normal.  Well groomed, good eye contact, normal speech and thoughts  Nursing note and vitals reviewed.  Recent Labs    01/14/18 1614  HGBA1C 11.0*    Results for orders placed or performed in visit on 01/14/18  POCT HgB A1C  Result Value Ref Range   Hemoglobin A1C 11.0 (A) 4.0 - 5.6 %      Assessment & Plan:   Problem List Items Addressed This Visit    Arthropathy of right shoulder    Clinically with significantly improved R shoulder - likely rotator cuff strain vs tendinopathy from prior traumatic fall injury Last X-ray 12/2017 negative for arthritis/DJD  Plan - Encourage continue current regimen, NSAID, rest, ROM activities, avoid overuse - Offered future injection, refer to PT or ortho Follow-up PRN -  may take 1-2 more months for improvement      Type 2 diabetes mellitus with other specified complication ([place]) - Primary    Uncontrolled DM with A1c still >10 now up to 11, with hyperglycemia. Complications - other including hyperlipidemia - increases risk of future cardiovascular complications   Plan:  1. Discussion on adjusting DM management with meds / lifestyle - Again emphasized importance of starting new med such as GLP1 agent - would be best for him for adherence, efficacy, and given he has some lower extremity symptoms would prefer to avoid SGLT2 - AGAIN handout given with names and preferences, he is to check ins coverage next, will make copay card available once we hear back on which med, discussed potential side effect nausea, take with meal, weekly dosing for most, benefits wt loss, BP control, cardiovascular risk reduction based on which one - Continue Metformin XR 500mg  x 2 daily 2. Encourage improved lifestyle - low carb, low sugar diet, reduce portion size, start regular exercise. Handouts given. 3. Needs to check CBG, bring log to next visit for review 4. Continue ARB, Statin - UTD DM Eye Dr Matilde Sprang / DM Foot 5. Follow-up 3 months DM A1c adjust - Recommend future Nutritionist referral - he declines again      Relevant Orders   POCT HgB A1C (Completed)    Other Visit Diagnoses    Need for vaccination with 13-polyvalent pneumococcal conjugate vaccine       Relevant Orders   Pneumococcal conjugate vaccine 13-valent IM (Completed)      No orders of the defined types were placed in this encounter.   Follow up plan: Return in about 3 months (around 04/15/2018) for DM A1c.   A total of 28 minutes was spent face-to-face with this patient. Greater than 50% of this time (approximately 15 minutes) was spent in counseling on Diabetes pathophysiology, medication options specifically GLP1 agents benefit risk side effect dosing, lifestyle management, future risk  complications.  Nobie Putnam, DO [facility] 01/14/2018, 4:14 PM

## 2018-01-14 NOTE — Assessment & Plan Note (Signed)
Clinically with significantly improved R shoulder - likely rotator cuff strain vs tendinopathy from prior traumatic fall injury Last X-ray 12/2017 negative for arthritis/DJD  Plan - Encourage continue current regimen, NSAID, rest, ROM activities, avoid overuse - Offered future injection, refer to PT or ortho Follow-up PRN - may take 1-2 more months for improvement

## 2018-01-22 ENCOUNTER — Telehealth: Payer: Self-pay

## 2018-01-22 DIAGNOSIS — E1169 Type 2 diabetes mellitus with other specified complication: Secondary | ICD-10-CM

## 2018-01-22 MED ORDER — SEMAGLUTIDE(0.25 OR 0.5MG/DOS) 2 MG/1.5ML ~~LOC~~ SOPN: 0.2500 mg | PEN_INJECTOR | SUBCUTANEOUS | 2 refills | Status: DC

## 2018-01-22 NOTE — Telephone Encounter (Signed)
Patient called reporting that his insurance will cover Trulicity and Ozempic.  He would like your opinion on which would be the best medication for him.  Please send rx into [facility].

## 2018-01-22 NOTE — Telephone Encounter (Addendum)
Called patient  Prefer Ozempic.  Sent new rx to [facility] start 0.25mg  weekly inj x 4 weeks then increase to 0.5mg  weekly. Has refills continue 0.5mg  weekly until next follow-up  Nobie Putnam, [facility] 01/22/2018, 10:48 AM

## 2018-01-26 ENCOUNTER — Other Ambulatory Visit: Payer: Self-pay | Admitting: Family Medicine

## 2018-02-09 ENCOUNTER — Telehealth: Payer: Self-pay

## 2018-02-09 DIAGNOSIS — G8929 Other chronic pain: Secondary | ICD-10-CM

## 2018-02-09 DIAGNOSIS — M67911 Unspecified disorder of synovium and tendon, right shoulder: Secondary | ICD-10-CM

## 2018-02-09 DIAGNOSIS — M25511 Pain in right shoulder: Principal | ICD-10-CM

## 2018-02-09 NOTE — Telephone Encounter (Signed)
Patient called reporting he is still having right shoulder pain and would like a referral to Dr. Elsie Saas at [facility] and [place].

## 2018-02-09 NOTE — Telephone Encounter (Signed)
Referral order placed today  Referral to [facility] - to Dr Elsie Saas - for Right Shoulder Pain - chronic problem for >2-3 months following traumatic fall injury 12/2017, had negative x-rays, partial benefit on conservative care medication and exercises, now requesting referral and 2nd opinion from orthopedic. Patient may prefer [place] / [place] location vs [place], will be up to the patient.   Nobie Putnam, DO [facility] 02/09/2018, 2:33 PM

## 2018-02-10 ENCOUNTER — Telehealth: Payer: Self-pay

## 2018-02-10 NOTE — Telephone Encounter (Signed)
Patient called checking on referral today.  Can you please check this referral because this was Dr. Raliegh Ip notes:  Referral to [facility] - to Dr Elsie Saas - for Right Shoulder Pain - chronic problem for >2-3 months following traumatic fall injury 12/2017, had negative x-rays, partial benefit on conservative care medication and exercises, now requesting referral and 2nd opinion from orthopedic. Patient may prefer [place] / [place] location vs [place], will be up to the patient.  Accordind to your referral notes you sent it to [facility].  Patient requested [facility] Dr. Elsie Saas.    Thank you

## 2018-02-11 DIAGNOSIS — S46011A Strain of muscle(s) and tendon(s) of the rotator cuff of right shoulder, initial encounter: Secondary | ICD-10-CM | POA: Diagnosis not present

## 2018-02-16 DIAGNOSIS — M25511 Pain in right shoulder: Secondary | ICD-10-CM | POA: Diagnosis not present

## 2018-02-18 DIAGNOSIS — S46011D Strain of muscle(s) and tendon(s) of the rotator cuff of right shoulder, subsequent encounter: Secondary | ICD-10-CM | POA: Diagnosis not present

## 2018-02-27 DIAGNOSIS — S46011D Strain of muscle(s) and tendon(s) of the rotator cuff of right shoulder, subsequent encounter: Secondary | ICD-10-CM | POA: Diagnosis not present

## 2018-03-02 ENCOUNTER — Telehealth: Payer: Self-pay | Admitting: Family Medicine

## 2018-03-02 ENCOUNTER — Other Ambulatory Visit: Payer: Self-pay | Admitting: Family Medicine

## 2018-03-02 DIAGNOSIS — Z01818 Encounter for other preprocedural examination: Secondary | ICD-10-CM

## 2018-03-02 DIAGNOSIS — I10 Essential (primary) hypertension: Secondary | ICD-10-CM

## 2018-03-02 DIAGNOSIS — E1169 Type 2 diabetes mellitus with other specified complication: Secondary | ICD-10-CM

## 2018-03-02 NOTE — Telephone Encounter (Signed)
Called patient, reviewed fax from [facility], for R shoulder - will require Medical Clearance pre-op visit, for likely total R shoulder reverse total shoulder replacement surgery.  His previous apt w/ me was for 3 month DM A1c 3/25, however he request to be seen sooner, now he is improving with Ozempic. He confirmed to take 0.5mg  dose weekly, tolerating well, mild nausea at times. He has had reduce appetite and some wt loss and doing well.  He understands will have to likely pay for A1c if checked before 3 months, he understands prefer to check sooner.  I have re-scheduled him to 04/01/18 for visit, for pre-op clearance, and then lab only fasting week before 03/24/18 - including A1c, will discuss DM and his pre op at that visit. We will likely need to add CXR and EKG at that visit. He will have CMET, A1c CBC on 2/18.  Goal for A1c < 7 ideally, but for proceeding with surgery Ortho request < 9 at least.  Pre-op clearance form was placed by me in the folder under nursing station for upcoming apt. 04/01/18  Nobie Putnam, [facility] 03/02/2018, 6:12 PM

## 2018-03-12 ENCOUNTER — Other Ambulatory Visit: Payer: Self-pay | Admitting: Family Medicine

## 2018-03-12 DIAGNOSIS — I10 Essential (primary) hypertension: Secondary | ICD-10-CM

## 2018-03-24 ENCOUNTER — Other Ambulatory Visit: Payer: PPO

## 2018-03-24 DIAGNOSIS — I10 Essential (primary) hypertension: Secondary | ICD-10-CM | POA: Diagnosis not present

## 2018-03-24 DIAGNOSIS — Z01818 Encounter for other preprocedural examination: Secondary | ICD-10-CM | POA: Diagnosis not present

## 2018-03-24 DIAGNOSIS — E1169 Type 2 diabetes mellitus with other specified complication: Secondary | ICD-10-CM | POA: Diagnosis not present

## 2018-03-25 LAB — CBC WITH DIFFERENTIAL/PLATELET
ABSOLUTE MONOCYTES: 834 {cells}/uL (ref 200–950)
Basophils Absolute: 58 cells/uL (ref 0–200)
Basophils Relative: 0.6 %
Eosinophils Absolute: 359 cells/uL (ref 15–500)
Eosinophils Relative: 3.7 %
HCT: 51.6 % — ABNORMAL HIGH (ref 38.5–50.0)
HEMOGLOBIN: 18.2 g/dL — AB (ref 13.2–17.1)
Lymphs Abs: 1892 cells/uL (ref 850–3900)
MCH: 32 pg (ref 27.0–33.0)
MCHC: 35.3 g/dL (ref 32.0–36.0)
MCV: 90.7 fL (ref 80.0–100.0)
MPV: 9.9 fL (ref 7.5–12.5)
Monocytes Relative: 8.6 %
Neutro Abs: 6557 cells/uL (ref 1500–7800)
Neutrophils Relative %: 67.6 %
Platelets: 308 10*3/uL (ref 140–400)
RBC: 5.69 10*6/uL (ref 4.20–5.80)
RDW: 13.4 % (ref 11.0–15.0)
Total Lymphocyte: 19.5 %
WBC: 9.7 10*3/uL (ref 3.8–10.8)

## 2018-03-25 LAB — COMPLETE METABOLIC PANEL WITH GFR
AG Ratio: 1.6 (calc) (ref 1.0–2.5)
ALT: 34 U/L (ref 9–46)
AST: 21 U/L (ref 10–35)
Albumin: 4.4 g/dL (ref 3.6–5.1)
Alkaline phosphatase (APISO): 65 U/L (ref 35–144)
BUN: 14 mg/dL (ref 7–25)
CO2: 26 mmol/L (ref 20–32)
CREATININE: 0.94 mg/dL (ref 0.70–1.25)
Calcium: 9.9 mg/dL (ref 8.6–10.3)
Chloride: 103 mmol/L (ref 98–110)
GFR, Est African American: 98 mL/min/{1.73_m2} (ref >=60)
GFR, Est Non African American: 85 mL/min/{1.73_m2} (ref >=60)
Globulin: 2.7 g/dL (calc) (ref 1.9–3.7)
Glucose, Bld: 144 mg/dL — ABNORMAL HIGH (ref 65–99)
Potassium: 4.5 mmol/L (ref 3.5–5.3)
Sodium: 137 mmol/L (ref 135–146)
Total Bilirubin: 0.5 mg/dL (ref 0.2–1.2)
Total Protein: 7.1 g/dL (ref 6.1–8.1)

## 2018-03-25 LAB — HEMOGLOBIN A1C
EAG (MMOL/L): 10.6 (calc)
Hgb A1c MFr Bld: 8.3 % of total Hgb — ABNORMAL HIGH (ref <5.7)
Mean Plasma Glucose: 192 (calc)

## 2018-04-01 ENCOUNTER — Encounter: Payer: Self-pay | Admitting: Family Medicine

## 2018-04-01 ENCOUNTER — Other Ambulatory Visit: Payer: Self-pay

## 2018-04-01 ENCOUNTER — Ambulatory Visit (INDEPENDENT_AMBULATORY_CARE_PROVIDER_SITE_OTHER): Payer: PPO | Admitting: Family Medicine

## 2018-04-01 VITALS — BP 111/62 | HR 83 | Temp 98.5°F | Resp 16 | Ht 72.0 in | Wt 205.0 lb

## 2018-04-01 DIAGNOSIS — M19011 Primary osteoarthritis, right shoulder: Secondary | ICD-10-CM

## 2018-04-01 DIAGNOSIS — Z01818 Encounter for other preprocedural examination: Secondary | ICD-10-CM

## 2018-04-01 DIAGNOSIS — E1169 Type 2 diabetes mellitus with other specified complication: Secondary | ICD-10-CM

## 2018-04-01 NOTE — Patient Instructions (Addendum)
Thank you for coming to the office today.  If BP is too low with weight loss then you can HOLD the Amlodipine 5mg  daily - and this may be first one we stop if need  Keep trying to maintain nutrition as discussed and stay hydrated. The medication ozempic 0.5 weekly is causing these side effects with appetite I believe  Recent Labs    01/14/18 1614 03/24/18 0815  HGBA1C 11.0* 8.3*   I am hesitant about the surgery - you are cleared at this point to proceed if you would like to. Call and let me know if I should send paperwork back to [facility])  You are only missing a Chest X-ray - you can come in any time during business hours for this X-ray if you want to proceed with surgery, let me know in advance and I can order it.   Please schedule a Follow-up Appointment to: Return in about 3 months (around 06/30/2018) for DM A1c, med adjust, Weight BP, R Shoulder ?surgery.  If you have any other questions or concerns, please feel free to call the office or send a message through [place]. You may also schedule an earlier appointment if necessary.  Additionally, you may be receiving a survey about your experience at our office within a few days to 1 week by e-mail or mail. We value your feedback.  Nobie Putnam, DO [place]

## 2018-04-01 NOTE — Progress Notes (Addendum)
Subjective:    [name], male    DOB: May 17, 1950, 68 y.o.   MRN: [id number]  Jonathan Faulkner is a 68 y.o. male presenting on 04/01/2018 for pre-op evalutation   HPI   PRE-OPERATIVE MEDICAL CLEARANCE Anticipated upcoming surgery - Right shoulder reverse total arthroplasty, by Dr Noemi Chapel (at [facility]) TBD - previous postponed due to elevated A1c  - Today patient reports he is unsure if he wants to proceed with shoulder surgery due to limitation of time out of work, financially among other concerns with recovery time duration.  Regarding surgical and anesthesia history: - Never had surgery before. He is adopted. No known personal or family history of tolerating anesthesia. - Able to tolerate regular exercise up to >4 METs, walking up flight of stairs - No known history of cardiovascular disease. Never had MI or known CAD. - No known history of pulmonary disease. Active smoker cigar small about 3-4 per day, >40 years. Never diagnosed with COPD, Asthma, OSA  - Denies exertional symptoms of chest pain or tightness, dyspnea, coughing, apnea, syncopal episodes, palpitations   FOLLOW-UP RIGHT SHOULDER PAIN Per [facility] he had MRI showed complete rotator cuff tear multiple areas - advised him to have joint replacement - Today patient reports overall he is improved from last visit - Taking Naproxen PRN - He is using Deep Blue topical rub still with good results - He is not interested in injection or PT refer - Denies anynew injury or trauma, other joint or injury, worsening numbness tingling in hand or arm, swelling or redness  Type 2 Diabetes, uncontrolled Last visit 01/2018. Recently started on Ozempic trial. after 3 weeks ago he increase dose to ozempic 0.5mg  - Last lab showed improved A1c down to 8.3, he is within range to potentially proceed with surgery now with A1c < 9.  - reduced portion size due to reduced appetite, some queasy mild  nausea w/o vomit - he feels better overall, more energy - Weight loss - Medications: Metformin XR 500mg  x2 daily, Ozempic 0.5mg  weekly inj  Past Surgical History:  Procedure Laterality Date  . None      Depression screen [facility] 2/9 04/01/2018 01/14/2018 12/16/2017  Decreased Interest 0 0 0  Down, Depressed, Hopeless 0 0 0  PHQ - 2 Score 0 0 0    Social History   Tobacco Use  . Smoking status: Current Every Day Smoker    Years: 47.00    Types: Cigars  . Smokeless tobacco: Current User  . Tobacco comment: 3-4 day  Substance Use Topics  . Alcohol use: No  . Drug use: No    Review of Systems Per HPI unless specifically indicated above     Objective:    BP 111/62   Pulse 83   Temp 98.5 F (36.9 C) (Oral)   Resp 16   Ht 6' (1.829 m)   Wt 205 lb (93 kg)   BMI 27.80 kg/m   Wt Readings from Last 3 Encounters:  04/01/18 205 lb (93 kg)  01/14/18 211 lb 9.6 oz (96 kg)  12/16/17 213 lb (96.6 kg)    Physical Exam Vitals signs and nursing note reviewed.  Constitutional:      General: He is not in acute distress.    Appearance: He is well-developed. He is not diaphoretic.     Comments: Well-appearing, comfortable, cooperative  HENT:     Head: Normocephalic and atraumatic.  Eyes:     General:  Right eye: No discharge.        Left eye: No discharge.     Conjunctiva/sclera: Conjunctivae normal.  Neck:     Musculoskeletal: Normal range of motion and neck supple.     Thyroid: No thyromegaly.  Cardiovascular:     Rate and Rhythm: Normal rate and regular rhythm.     Heart sounds: Normal heart sounds. No murmur.  Pulmonary:     Effort: Pulmonary effort is normal. No respiratory distress.     Breath sounds: Normal breath sounds. No wheezing or rales.     Comments: Good air movement. Musculoskeletal:     Comments: Reduced R shoulder ROM flexion above shoulder height, abduction and internal rotation behind back.  Lymphadenopathy:     Cervical: No cervical adenopathy.   Skin:    General: Skin is warm and dry.     Findings: No erythema or rash.  Neurological:     Mental Status: He is alert and oriented to person, place, and time.  Psychiatric:        Behavior: Behavior normal.     Comments: Well groomed, good eye contact, normal speech and thoughts      Recent Labs    01/14/18 1614 03/24/18 0815  HGBA1C 11.0* 8.3*    EKG - performed in office today  Date: 04/01/18  Rate: 77  Rhythm: normal sinus rhythm  QRS Axis: normal  Intervals: normal  ST/T Wave abnormalities: normal  Conduction Disutrbances:right bundle branch block  Additional Narrative Interpretation: none  Old EKG Reviewed: unchanged  03/16/15 RBB comparison   Results for orders placed or performed in visit on 03/24/18  COMPLETE METABOLIC PANEL WITH GFR  Result Value Ref Range   Glucose, Bld 144 (H) 65 - 99 mg/dL   BUN 14 7 - 25 mg/dL   Creat 0.94 0.70 - 1.25 mg/dL   GFR, Est Non African American 85 > OR = 60 mL/min/1.24m2   GFR, Est African American 98 > OR = 60 mL/min/1.62m2   BUN/Creatinine Ratio NOT APPLICABLE 6 - 22 (calc)   Sodium 137 135 - 146 mmol/L   Potassium 4.5 3.5 - 5.3 mmol/L   Chloride 103 98 - 110 mmol/L   CO2 26 20 - 32 mmol/L   Calcium 9.9 8.6 - 10.3 mg/dL   Total Protein 7.1 6.1 - 8.1 g/dL   Albumin 4.4 3.6 - 5.1 g/dL   Globulin 2.7 1.9 - 3.7 g/dL (calc)   AG Ratio 1.6 1.0 - 2.5 (calc)   Total Bilirubin 0.5 0.2 - 1.2 mg/dL   Alkaline phosphatase (APISO) 65 35 - 144 U/L   AST 21 10 - 35 U/L   ALT 34 9 - 46 U/L  CBC with Differential/Platelet  Result Value Ref Range   WBC 9.7 3.8 - 10.8 Thousand/uL   RBC 5.69 4.20 - 5.80 Million/uL   Hemoglobin 18.2 (H) 13.2 - 17.1 g/dL   HCT 51.6 (H) 38.5 - 50.0 %   MCV 90.7 80.0 - 100.0 fL   MCH 32.0 27.0 - 33.0 pg   MCHC 35.3 32.0 - 36.0 g/dL   RDW 13.4 11.0 - 15.0 %   Platelets 308 140 - 400 Thousand/uL   MPV 9.9 7.5 - 12.5 fL   Neutro Abs 6,557 1,500 - 7,800 cells/uL   Lymphs Abs 1,892 850 - 3,900 cells/uL     Absolute Monocytes 834 200 - 950 cells/uL   Eosinophils Absolute 359 15 - 500 cells/uL   Basophils Absolute 58 0 - 200 cells/uL  Neutrophils Relative % 67.6 %   Total Lymphocyte 19.5 %   Monocytes Relative 8.6 %   Eosinophils Relative 3.7 %   Basophils Relative 0.6 %  Hemoglobin A1c  Result Value Ref Range   Hgb A1c MFr Bld 8.3 (H) <5.7 % of total Hgb   Mean Plasma Glucose 192 (calc)   eAG (mmol/L) 10.6 (calc)    I have personally reviewed the radiology report from Chest X-ray Pre-operative on 04/07/18.  CLINICAL DATA:  Preop right shoulder surgery, smoker.  EXAM: CHEST - 2 VIEW  COMPARISON:  None.  FINDINGS: Heart and mediastinal contours are within normal limits. No focal opacities or effusions. No acute bony abnormality.  IMPRESSION: No active cardiopulmonary disease.   Electronically Signed   By: Rolm Baptise M.D.   On: 04/07/2018 15:09     Assessment & Plan:   Problem List Items Addressed This Visit    Arthropathy of right shoulder    Clinically with chronic R shoulder pain and limited movement Documented per ortho, R shoulder rotator cuff tear Last X-ray 12/2017 negative for arthritis/DJD  Plan Discussed possibility of proceed with surgery  - Encourage continue current regimen, NSAID, rest, ROM activities, avoid overuse - Offered future injection, PT, if interested will refer to 2nd opinion ortho  Functionally significant limited by shoulder or out of work time period, causing patient to doubt if ready to proceed with shoulder surgery      Type 2 diabetes mellitus with other specified complication ([place]) - Primary    Significantly improved DM now to A1c 8.3 from prior 11 Reduced hyperglycemia. No hypoglycemia Complications - other including hyperlipidemia - increases risk of future cardiovascular complications   Plan:  1. CONTINUE Ozempic 0.5mg  weekly inj  - instead of dose increase - Continue Metformin XR 500mg  x 2 daily 2. Encourage  improved lifestyle - low carb, low sugar diet, reduce portion size, start regular exercise. Handouts given. 3. Continue ARB, Statin - UTD DM Eye Dr Matilde Sprang / DM Foot 5. Follow-up 3 months DM A1c adjust - consider scale back or stop        Other Visit Diagnoses    Pre-op evaluation       Relevant Orders   EKG 12-Lead      Pre-op clearance for non-cardiac surgery today, Right (intermediate risk) Total Shoulder Arthroplasty No prior surgeries with general anesthesia - unknown No known cardiac hx. Appropriate functional status >4 METs Active smoker (increased risk)  [place]  Plan: **Updated after review of chest x-ray results from 04/07/18 - about 1 week after visit, as patient was hesitant at time of pre-op consultation to determine if he would proceed or not**  MEDICALLY CLEARED to proceed with elective orthopedic surgery, R shoulder total arthroplasty  Completed form, fax to [facility] for Dr Ophelia Charter  Reviewed BMET without concern Repeat EKG - mostly unremarkable - stable RBBB Diabetes significantly controlled now - A1c 8.3 - he is within range, < 9 as goal to potentially proceed with surgery, optimal reduce risk will be A1c <7% Smoking cessation   Added CXR results above.   No orders of the defined types were placed in this encounter.   Follow up plan: Return in about 3 months (around 06/30/2018) for DM A1c, med adjust, Weight BP, R Shoulder ?surgery.  A total of 30 minutes was spent face-to-face with this patient. Greater than 50% of this time (approximately 17 minutes) was spent in counseling on diabetes medication and management,  and shoulder surgery details and recovery limitations and duration.   Nobie Putnam, [facility] 04/01/2018, 4:44 PM

## 2018-04-02 ENCOUNTER — Encounter: Payer: Self-pay | Admitting: Family Medicine

## 2018-04-02 NOTE — Assessment & Plan Note (Signed)
Clinically with chronic R shoulder pain and limited movement Documented per ortho, R shoulder rotator cuff tear Last X-ray 12/2017 negative for arthritis/DJD  Plan Discussed possibility of proceed with surgery  - Encourage continue current regimen, NSAID, rest, ROM activities, avoid overuse - Offered future injection, PT, if interested will refer to 2nd opinion ortho  Functionally significant limited by shoulder or out of work time period, causing patient to doubt if ready to proceed with shoulder surgery

## 2018-04-02 NOTE — Assessment & Plan Note (Signed)
Significantly improved DM now to A1c 8.3 from prior 11 Reduced hyperglycemia. No hypoglycemia Complications - other including hyperlipidemia - increases risk of future cardiovascular complications   Plan:  1. CONTINUE Ozempic 0.5mg  weekly inj  - instead of dose increase - Continue Metformin XR 500mg  x 2 daily 2. Encourage improved lifestyle - low carb, low sugar diet, reduce portion size, start regular exercise. Handouts given. 3. Continue ARB, Statin - UTD DM Eye Dr Matilde Sprang / DM Foot 5. Follow-up 3 months DM A1c adjust - consider scale back or stop

## 2018-04-07 ENCOUNTER — Ambulatory Visit
Admission: RE | Admit: 2018-04-07 | Discharge: 2018-04-07 | Disposition: A | Discharge status: Home / Self Care | Payer: PPO | Source: Ambulatory Visit | Attending: Family Medicine | Admitting: Family Medicine

## 2018-04-07 ENCOUNTER — Other Ambulatory Visit: Payer: Self-pay | Admitting: Family Medicine

## 2018-04-07 DIAGNOSIS — Z01818 Encounter for other preprocedural examination: Secondary | ICD-10-CM | POA: Insufficient documentation

## 2018-04-07 DIAGNOSIS — Z01811 Encounter for preprocedural respiratory examination: Secondary | ICD-10-CM | POA: Diagnosis not present

## 2018-04-13 ENCOUNTER — Other Ambulatory Visit: Payer: Self-pay | Admitting: Family Medicine

## 2018-04-13 DIAGNOSIS — E1165 Type 2 diabetes mellitus with hyperglycemia: Principal | ICD-10-CM

## 2018-04-13 DIAGNOSIS — IMO0001 Reserved for inherently not codable concepts without codable children: Secondary | ICD-10-CM

## 2018-04-15 NOTE — Patient Instructions (Signed)
Jonathan Faulkner  04/15/2018   Your procedure is scheduled on: Wednesday 04/29/2018  Report to [facility] Main  Entrance              Report to admitting at   0530 AM    Call this number if you have problems the morning of surgery [phone]    How to Manage Your Diabetes Before and After Surgery  Why is it important to control my blood sugar before and after surgery? . Improving blood sugar levels before and after surgery helps healing and can limit problems. . A way of improving blood sugar control is eating a healthy diet by: o  Eating less sugar and carbohydrates o  Increasing activity/exercise o  Talking with your doctor about reaching your blood sugar goals . High blood sugars (greater than 180 mg/dL) can raise your risk of infections and slow your recovery, so you will need to focus on controlling your diabetes during the weeks before surgery. . Make sure that the doctor who takes care of your diabetes knows about your planned surgery including the date and location.  How do I manage my blood sugar before surgery? . Check your blood sugar at least 4 times a day, starting 2 days before surgery, to make sure that the level is not too high or low. o Check your blood sugar the morning of your surgery when you wake up and every 2 hours until you get to the Short Stay unit. . If your blood sugar is less than 70 mg/dL, you will need to treat for low blood sugar: o Do not take insulin. o Treat a low blood sugar (less than 70 mg/dL) with  cup of clear juice (cranberry or apple), 4 glucose tablets, OR glucose gel. o Recheck blood sugar in 15 minutes after treatment (to make sure it is greater than 70 mg/dL). If your blood sugar is not greater than 70 mg/dL on recheck, call [phone] for further instructions. . Report your blood sugar to the short stay nurse when you get to Short Stay.  . If you are admitted to the hospital after surgery: o Your blood  sugar will be checked by the staff and you will probably be given insulin after surgery (instead of oral diabetes medicines) to make sure you have good blood sugar levels. o The goal for blood sugar control after surgery is 80-180 mg/dL.   WHAT DO I DO ABOUT MY DIABETES MEDICATION?        The day before surgery, Take Metformin as usual!  . Do not take oral diabetes medicines (pills) the morning of surgery.   . The day of surgery, do not take other diabetes injectables, including Byetta (exenatide), Bydureon (exenatide ER), Victoza (liraglutide), or Trulicity (dulaglutide), Semaglutide (Ozempic)       Remember: Do not eat food or drink liquids :After Midnight.               BRUSH YOUR TEETH MORNING OF SURGERY AND RINSE YOUR MOUTH OUT, NO CHEWING GUM CANDY OR MINTS.     Take these medicines the morning of surgery with A SIP OF WATER: Amlodipine (Norvasc)               DO NOT TAKE ANY DIABETIC MEDICATIONS DAY OF YOUR SURGERY!  You may not have any metal on your body including hair pins and              piercings  Do not wear jewelry, make-up, lotions, powders or perfumes, deodorant                          Men may shave face and neck.   Do not bring valuables to the hospital. [place].  Contacts, dentures or bridgework may not be worn into surgery.  Leave suitcase in the car. After surgery it may be brought to your room.                  Please read over the following fact sheets you were given: _____________________________________________________________________             [facility] - Preparing for Surgery Before surgery, you can play an important role.  Because skin is not sterile, your skin needs to be as free of germs as possible.  You can reduce the number of germs on your skin by washing with CHG (chlorahexidine gluconate) soap before surgery.  CHG is an antiseptic cleaner which kills  germs and bonds with the skin to continue killing germs even after washing. Please DO NOT use if you have an allergy to CHG or antibacterial soaps.  If your skin becomes reddened/irritated stop using the CHG and inform your nurse when you arrive at Short Stay. Do not shave (including legs and underarms) for at least 48 hours prior to the first CHG shower.  You may shave your face/neck. Please follow these instructions carefully:  1.  Shower with CHG Soap the night before surgery and the  morning of Surgery.  2.  If you choose to wash your hair, wash your hair first as usual with your  normal  shampoo.  3.  After you shampoo, rinse your hair and body thoroughly to remove the  shampoo.                           4.  Use CHG as you would any other liquid soap.  You can apply chg directly  to the skin and wash                       Gently with a scrungie or clean washcloth.  5.  Apply the CHG Soap to your body ONLY FROM THE NECK DOWN.   Do not use on face/ open                           Wound or open sores. Avoid contact with eyes, ears mouth and genitals (private parts).                       Wash face,  Genitals (private parts) with your normal soap.             6.  Wash thoroughly, paying special attention to the area where your surgery  will be performed.  7.  Thoroughly rinse your body with warm water from the neck down.  8.  DO NOT shower/wash with your normal soap after using and rinsing off  the CHG Soap.  9.  Pat yourself dry with a clean towel.            10.  Wear clean pajamas.            11.  Place clean sheets on your bed the night of your first shower and do not  sleep with pets. Day of Surgery : Do not apply any lotions/deodorants the morning of surgery.  Please wear clean clothes to the hospital/surgery center.  FAILURE TO FOLLOW THESE INSTRUCTIONS MAY RESULT IN THE CANCELLATION OF YOUR SURGERY PATIENT SIGNATURE_________________________________  NURSE  SIGNATURE__________________________________  ________________________________________________________________________   Jonathan Faulkner  An incentive spirometer is a tool that can help keep your lungs clear and active. This tool measures how well you are filling your lungs with each breath. Taking long deep breaths may help reverse or decrease the chance of developing breathing (pulmonary) problems (especially infection) following:  A long period of time when you are unable to move or be active. BEFORE THE PROCEDURE   If the spirometer includes an indicator to show your best effort, your nurse or respiratory therapist will set it to a desired goal.  If possible, sit up straight or lean slightly forward. Try not to slouch.  Hold the incentive spirometer in an upright position. INSTRUCTIONS FOR USE  1. Sit on the edge of your bed if possible, or sit up as far as you can in bed or on a chair. 2. Hold the incentive spirometer in an upright position. 3. Breathe out normally. 4. Place the mouthpiece in your mouth and seal your lips tightly around it. 5. Breathe in slowly and as deeply as possible, raising the piston or the ball toward the top of the column. 6. Hold your breath for 3-5 seconds or for as long as possible. Allow the piston or ball to fall to the bottom of the column. 7. Remove the mouthpiece from your mouth and breathe out normally. 8. Rest for a few seconds and repeat Steps 1 through 7 at least 10 times every 1-2 hours when you are awake. Take your time and take a few normal breaths between deep breaths. 9. The spirometer may include an indicator to show your best effort. Use the indicator as a goal to work toward during each repetition. 10. After each set of 10 deep breaths, practice coughing to be sure your lungs are clear. If you have an incision (the cut made at the time of surgery), support your incision when coughing by placing a pillow or rolled up towels firmly  against it. Once you are able to get out of bed, walk around indoors and cough well. You may stop using the incentive spirometer when instructed by your caregiver.  RISKS AND COMPLICATIONS  Take your time so you do not get dizzy or light-headed.  If you are in pain, you may need to take or ask for pain medication before doing incentive spirometry. It is harder to take a deep breath if you are having pain. AFTER USE  Rest and breathe slowly and easily.  It can be helpful to keep track of a log of your progress. Your caregiver can provide you with a simple table to help with this. If you are using the spirometer at home, follow these instructions: [place] IF:   You are having difficultly using the spirometer.  You have trouble using the spirometer as often as instructed.  Your pain medication is not giving enough relief while using the spirometer.  You  develop fever of 100.5 F (38.1 C) or higher. SEEK IMMEDIATE MEDICAL CARE IF:   You cough up bloody sputum that had not been present before.  You develop fever of 102 F (38.9 C) or greater.  You develop worsening pain at or near the incision site. MAKE SURE YOU:   Understand these instructions.  Will watch your condition.  Will get help right away if you are not doing well or get worse. Document Released: 06/03/2006 Document Revised: 04/15/2011 Document Reviewed: 08/04/2006 [facility] Patient Information 2014 [place], [place].   ________________________________________________________________________

## 2018-04-15 NOTE — Progress Notes (Signed)
04/07/2018- noted in Epic-CXR  04/01/2018- noted in [place] from Dr. Nobie Putnam  03/24/2018- noted in Epic-Labs-CBC w/diff., HgA1C, CMP  04/06/2015- noted in Epic- ECHO

## 2018-04-16 ENCOUNTER — Encounter (HOSPITAL_COMMUNITY)
Admission: RE | Admit: 2018-04-16 | Discharge: 2018-04-16 | Disposition: A | Discharge status: Home / Self Care | Payer: PPO | Source: Ambulatory Visit | Attending: Family Medicine | Admitting: Family Medicine

## 2018-04-21 ENCOUNTER — Other Ambulatory Visit: Payer: Self-pay | Admitting: Family Medicine

## 2018-04-21 DIAGNOSIS — E1169 Type 2 diabetes mellitus with other specified complication: Secondary | ICD-10-CM

## 2018-04-29 ENCOUNTER — Ambulatory Visit: Payer: PPO | Admitting: Family Medicine

## 2018-04-29 ENCOUNTER — Encounter (HOSPITAL_COMMUNITY): Payer: Self-pay

## 2018-04-29 ENCOUNTER — Inpatient Hospital Stay (HOSPITAL_COMMUNITY): Admit: 2018-04-29 | Payer: PPO | Admitting: Orthopaedic Surgery

## 2018-04-29 SURGERY — ARTHROPLASTY, SHOULDER, TOTAL
Anesthesia: Choice | Laterality: Right

## 2018-07-06 ENCOUNTER — Ambulatory Visit (INDEPENDENT_AMBULATORY_CARE_PROVIDER_SITE_OTHER): Payer: PPO | Admitting: Family Medicine

## 2018-07-06 ENCOUNTER — Encounter: Payer: Self-pay | Admitting: Family Medicine

## 2018-07-06 ENCOUNTER — Other Ambulatory Visit: Payer: Self-pay

## 2018-07-06 ENCOUNTER — Other Ambulatory Visit: Payer: Self-pay | Admitting: Family Medicine

## 2018-07-06 VITALS — Ht 72.0 in | Wt 193.0 lb

## 2018-07-06 DIAGNOSIS — I10 Essential (primary) hypertension: Secondary | ICD-10-CM

## 2018-07-06 DIAGNOSIS — Z1159 Encounter for screening for other viral diseases: Secondary | ICD-10-CM

## 2018-07-06 DIAGNOSIS — E663 Overweight: Secondary | ICD-10-CM | POA: Diagnosis not present

## 2018-07-06 DIAGNOSIS — E1169 Type 2 diabetes mellitus with other specified complication: Secondary | ICD-10-CM | POA: Diagnosis not present

## 2018-07-06 DIAGNOSIS — R351 Nocturia: Secondary | ICD-10-CM

## 2018-07-06 DIAGNOSIS — Z0000 Encounter for general adult medical examination without abnormal findings: Secondary | ICD-10-CM

## 2018-07-06 DIAGNOSIS — E785 Hyperlipidemia, unspecified: Secondary | ICD-10-CM

## 2018-07-06 DIAGNOSIS — Z114 Encounter for screening for human immunodeficiency virus [HIV]: Secondary | ICD-10-CM

## 2018-07-06 MED ORDER — SEMAGLUTIDE(0.25 OR 0.5MG/DOS) 2 MG/1.5ML ~~LOC~~ SOPN: 0.5000 mg | PEN_INJECTOR | SUBCUTANEOUS | 3 refills | Status: DC

## 2018-07-06 NOTE — Assessment & Plan Note (Signed)
Previously improved T2DM, A1c 8 range, due for repeat A1c - continued improve sugar and wt loss on GLP1 No hypoglycemia Complications - other including hyperlipidemia - increases risk of future cardiovascular complications   Plan:  1. CONTINUE Ozempic 0.5mg  weekly inj  - instead of dose increase - offered 1mg  dose but consider in future - Continue Metformin XR 500mg  x 2 daily = 1000mg  2. Encourage improved lifestyle - low carb, low sugar diet, reduce portion size, start regular exercise. Handouts given. 3. Continue ARB, Statin - UTD DM Eye Dr Matilde Sprang / DM Foot 5. Follow-up 3 months annual

## 2018-07-06 NOTE — Patient Instructions (Signed)
Thank you for coming to the office today.  DUE for FASTING BLOOD WORK (no food or drink after midnight before the lab appointment, only water or coffee without cream/sugar on the morning of)  SCHEDULE "Lab Only" visit in the morning at the clinic for lab draw in 3 MONTHS   - Make sure Lab Only appointment is at about 1 week before your next appointment, so that results will be available  For Lab Results, once available within 2-3 days of blood draw, you can can log in to MyChart online to view your results and a brief explanation. Also, we can discuss results at next follow-up visit.   Please schedule a Follow-up Appointment to: No follow-ups on file.  If you have any other questions or concerns, please feel free to call the office or send a message through MyChart. You may also schedule an earlier appointment if necessary.  Additionally, you may be receiving a survey about your experience at our office within a few days to 1 week by e-mail or mail. We value your feedback.  Alexander Karamalegos, DO [facility], [facility] 

## 2018-07-06 NOTE — Assessment & Plan Note (Signed)
Encourage weight loss, improve diet exercise Continue on GLP1

## 2018-07-06 NOTE — Progress Notes (Signed)
Virtual Visit via Telephone The purpose of this virtual visit is to provide medical care while limiting exposure to the novel coronavirus (COVID19) for both patient and office staff.  Consent was obtained for phone visit:  Yes.   Answered questions that patient had about telehealth interaction:  Yes.   I discussed the limitations, risks, security and privacy concerns of performing an evaluation and management service by telephone. I also discussed with the patient that there may be a patient responsible charge related to this service. The patient expressed understanding and agreed to proceed.  Patient Location: Home Provider Location: [facility])  ---------------------------------------------------------------------- Chief Complaint  Patient presents with  . Diabetes    S: Reviewed CMA documentation. I have called patient and gathered additional HPI as follows:  CHRONIC DM, Type 2 / Overweight BMI >26 - Last visit with me 03/2018, for same problem and pre-op, treated with continued ozempic with improvement on A1c and wt loss reduced appetite, see prior notes for background information. - Due for A1c lab will return this week - Today patient reports no new concerns. He prefers to keep on ozempic, has had good weight loss and overall improved CBGs: No CBG detailed readings today Meds: Ozempic 0.5mg  weekly injection, Metformin XR 1000mg  daily Reports good compliance. Tolerating well w/o side-effects Currently on ARB Lifestyle: - Weight down to 193 lbs - Diet (balanced, improving, reduced portions)  Denies hypoglycemia, polyuria, visual changes, numbness or tingling.  Denies any high risk travel to areas of current concern for COVID19. Denies any known or suspected exposure to person with or possibly with COVID19.  Denies any fevers, chills, sweats, body ache, cough, shortness of breath, sinus pain or pressure, headache, abdominal pain, diarrhea  Past Medical  History:  Diagnosis Date  . GERD (gastroesophageal reflux disease)   . Hypercholesterolemia   . Hypertension   . Measles    hx  . Varicella without mention of complication    hx   Social History   Tobacco Use  . Smoking status: Current Every Day Smoker    Years: 47.00    Types: Cigars  . Smokeless tobacco: Current User  . Tobacco comment: 3-4 day  Substance Use Topics  . Alcohol use: No  . Drug use: No    Current Outpatient Medications:  .  amLODipine (NORVASC) 5 MG tablet, TAKE 1 TABLET DAILY. (Patient taking differently: Take 5 mg by mouth every other day. ), Disp: 90 tablet, Rfl: 1 .  atorvastatin (LIPITOR) 10 MG tablet, TAKE ONE (1) TABLET BY MOUTH EVERY DAY AT SIX IN THE EVENING, Disp: 90 tablet, Rfl: 2 .  losartan (COZAAR) 100 MG tablet, TAKE 1 TABLET DAILY. (Patient taking differently: Take 100 mg by mouth daily. ), Disp: 90 tablet, Rfl: 1 .  metFORMIN (GLUCOPHAGE-XR) 500 MG 24 hr tablet, TAKE TWO TABLETS DAILY WITH BREAKFAST. (Patient taking differently: Take 1,000 mg by mouth daily with breakfast. ), Disp: 90 tablet, Rfl: 1 .  [START ON 07/11/2018] Semaglutide,0.25 or 0.5MG /DOS, (OZEMPIC, 0.25 OR 0.5 MG/DOSE,) 2 MG/1.5ML SOPN, Inject 0.5 mg into the skin every Saturday., Disp: 4.5 mL, Rfl: 3 .  naproxen (NAPROSYN) 500 MG tablet, TAKE (1) TABLET TWICE A DAY. (Patient taking differently: Take 500 mg by mouth daily. ), Disp: 60 tablet, Rfl: 2 .  omeprazole (PRILOSEC) 20 MG capsule, TAKE (1) CAPSULE TWICE DAILY BEFORE A MEAL. (Patient not taking: No sig reported), Disp: 60 capsule, Rfl: 2  Depression screen [facility] 2/9 07/06/2018 04/01/2018 01/14/2018  Decreased Interest 0 0 0  Down, Depressed, Hopeless 0 0 0  PHQ - 2 Score 0 0 0    No flowsheet data found.  -------------------------------------------------------------------------- O: No physical exam performed due to remote telephone encounter.  Lab results reviewed.  Recent Labs    01/14/18 1614 03/24/18 0815  HGBA1C  11.0* 8.3*     No results found for this or any previous visit (from the past 2160 hour(s)).  -------------------------------------------------------------------------- A&P:  Problem List Items Addressed This Visit    Overweight (BMI 25.0-29.9)    Encourage weight loss, improve diet exercise Continue on GLP1      Type 2 diabetes mellitus with other specified complication ([place]) - Primary    Previously improved T2DM, A1c 8 range, due for repeat A1c - continued improve sugar and wt loss on GLP1 No hypoglycemia Complications - other including hyperlipidemia - increases risk of future cardiovascular complications   Plan:  1. CONTINUE Ozempic 0.5mg  weekly inj  - instead of dose increase - offered 1mg  dose but consider in future - Continue Metformin XR 500mg  x 2 daily = 1000mg  2. Encourage improved lifestyle - low carb, low sugar diet, reduce portion size, start regular exercise. Handouts given. 3. Continue ARB, Statin - UTD DM Eye Dr Matilde Sprang / DM Foot 5. Follow-up 3 months annual      Relevant Medications   Semaglutide,0.25 or 0.5MG /DOS, (OZEMPIC, 0.25 OR 0.5 MG/DOSE,) 2 MG/1.5ML SOPN (Start on 07/11/2018)   Other Relevant Orders   Hemoglobin A1c      Meds ordered this encounter  Medications  . Semaglutide,0.25 or 0.5MG /DOS, (OZEMPIC, 0.25 OR 0.5 MG/DOSE,) 2 MG/1.5ML SOPN    Sig: Inject 0.5 mg into the skin every Saturday.    Dispense:  4.5 mL    Refill:  3    Follow-up: - Return in 3 months for Annual Physical - Future labs to be ordered to 10/2018 once review result of upcoming A1c this week  Patient verbalizes understanding with the above medical recommendations including the limitation of remote medical advice.  Specific follow-up and call-back criteria were given for patient to follow-up or seek medical care more urgently if needed.   - Time spent in direct consultation with patient on phone: 8 minutes  Nobie Putnam, [facility] 07/06/2018, 8:08 AM

## 2018-07-07 ENCOUNTER — Other Ambulatory Visit: Payer: Self-pay | Admitting: Family Medicine

## 2018-07-07 DIAGNOSIS — IMO0001 Reserved for inherently not codable concepts without codable children: Secondary | ICD-10-CM

## 2018-07-16 ENCOUNTER — Other Ambulatory Visit: Payer: Self-pay

## 2018-07-16 ENCOUNTER — Ambulatory Visit (INDEPENDENT_AMBULATORY_CARE_PROVIDER_SITE_OTHER): Payer: PPO

## 2018-07-16 DIAGNOSIS — E1169 Type 2 diabetes mellitus with other specified complication: Secondary | ICD-10-CM | POA: Diagnosis not present

## 2018-07-16 LAB — POCT GLYCOSYLATED HEMOGLOBIN (HGB A1C): Hemoglobin A1C: 5.8 % — AB (ref 4.0–5.6)

## 2018-07-17 ENCOUNTER — Other Ambulatory Visit: Payer: Self-pay | Admitting: Family Medicine

## 2018-07-17 DIAGNOSIS — I10 Essential (primary) hypertension: Secondary | ICD-10-CM

## 2018-07-17 DIAGNOSIS — Z1159 Encounter for screening for other viral diseases: Secondary | ICD-10-CM

## 2018-07-17 DIAGNOSIS — Z114 Encounter for screening for human immunodeficiency virus [HIV]: Secondary | ICD-10-CM

## 2018-07-17 DIAGNOSIS — E1169 Type 2 diabetes mellitus with other specified complication: Secondary | ICD-10-CM

## 2018-07-17 DIAGNOSIS — E785 Hyperlipidemia, unspecified: Secondary | ICD-10-CM

## 2018-07-17 DIAGNOSIS — Z125 Encounter for screening for malignant neoplasm of prostate: Secondary | ICD-10-CM

## 2018-07-17 DIAGNOSIS — Z0000 Encounter for general adult medical examination without abnormal findings: Secondary | ICD-10-CM

## 2018-09-21 ENCOUNTER — Other Ambulatory Visit: Payer: Self-pay | Admitting: Family Medicine

## 2018-09-21 DIAGNOSIS — I10 Essential (primary) hypertension: Secondary | ICD-10-CM

## 2018-11-09 ENCOUNTER — Telehealth: Payer: Self-pay | Admitting: Family Medicine

## 2018-11-09 NOTE — Chronic Care Management (AMB) (Signed)
Chronic Care Management   Note  11/09/2018 Name: Jonathan Faulkner MRN: [id number] DOB: [birth year]  Jonathan Faulkner is a 68 y.o. year old male who is a primary care patient of Olin Hauser, DO. I reached out to Laurier Nancy by phone today in response to a referral sent by [name]'s health plan.     [name] was given information about Chronic Care Management services today including:  1. CCM service includes personalized support from designated clinical staff supervised by his physician, including individualized plan of care and coordination with other care providers 2. 24/7 contact phone numbers for assistance for urgent and routine care needs. 3. Service will only be billed when office clinical staff spend 20 minutes or more in a month to coordinate care. 4. Only one practitioner may furnish and bill the service in a calendar month. 5. The patient may stop CCM services at any time (effective at the end of the month) by phone call to the office staff. 6. The patient will be responsible for cost sharing (co-pay) of up to 20% of the service fee (after annual deductible is met).  Patient agreed to services and verbal consent obtained.   Follow up plan: Telephone appointment with CCM team member scheduled for: 11/16/2018  [place]  ??[email or web address]   ??[phone]

## 2018-11-16 ENCOUNTER — Ambulatory Visit (INDEPENDENT_AMBULATORY_CARE_PROVIDER_SITE_OTHER): Payer: PPO | Admitting: Pharmacist

## 2018-11-16 DIAGNOSIS — E785 Hyperlipidemia, unspecified: Secondary | ICD-10-CM | POA: Diagnosis not present

## 2018-11-16 DIAGNOSIS — E1169 Type 2 diabetes mellitus with other specified complication: Secondary | ICD-10-CM | POA: Diagnosis not present

## 2018-11-16 DIAGNOSIS — I10 Essential (primary) hypertension: Secondary | ICD-10-CM

## 2018-11-16 NOTE — Patient Instructions (Signed)
Thank you allowing the Chronic Care Management Team to be a part of your care! It was a pleasure speaking with you today!     CCM (Chronic Care Management) Team    Janci Minor RN, BSN Nurse Care Coordinator  [phone]   Harlow Asa PharmD  Clinical Pharmacist  [phone]   Eula Fried LCSW Clinical Social Worker [phone]  Visit Information  Goals Addressed            This Visit's Progress   . PharmD - Medication Review       Current Barriers:  . Patient with type 2 diabetes mellitus not currently on statin for reduction of ASCVD risk  Pharmacist Clinical Goal(s):  Marland Kitchen Over the next 30 days, patient will work with CM Pharmacist and PCP to address barriers to statin therapy for Mr. Czaplewski  Interventions: . Assess adherence to statin (as identified by quality team/health plan).  o Counsel patient on benefit of taking statin medication for ASCVD risk reduction, particularly given patient with type 2 diabetes mellitus o Most recent lipid panel in chart from 04/09/2016 . Patient due for annual physical and lab work with PCP o Mr. Rabun called today to schedule this appointment o Agreeable to discussing restart of statin with provider at this appointment . Comprehensive medication review performed; medication list updated in electronic medical record . Reports he has been taking OTC naproxen 220mg  - 2 tablets each morning prn since he ran out of refills on his naproxen prescription. o Reports typically uses ~3-4x/week for back and muscle pain o Discuss using naproxen as needed and taking with food . Counsel on importance of blood sugar control o Reports taking:  - Ozempic 0.5 mg weekly (on Sundays) - Metformin ER 500 mg - 1 tablet daily with breakfast.  . Reports self-decreased his metformin at the beginning of this year when he started on Ozempic, as he was having more stomach upset with the 2 tablets daily. - Denies recently checking blood sugar at home.  Confirms having monitor . Denies currently checking blood pressure at home . Patient interested in obtaining flu vaccine at upcoming appointment  Patient Self Care Activities:  . Attends all scheduled provider appointments . Calls pharmacy for medication refills . Calls provider office for new concerns or questions   Initial goal documentation        The patient verbalized understanding of instructions provided today and declined a print copy of patient instruction materials.   The patient has been provided with contact information for CM Pharmacist and has been advised to call with any medication related questions or concerns.   Harlow Asa, PharmD, [place] [employer] [phone]

## 2018-11-16 NOTE — Chronic Care Management (AMB) (Signed)
Chronic Care Management   Note  11/16/2018 Name: Jonathan Faulkner MRN: [id number] DOB: May 25, 1950   Subjective:   Jonathan Faulkner is a 68 y.o. year old male who is a primary care patient of Jonathan Hauser, DO. The CM team was consulted for assistance with chronic disease management and care coordination.   I reached out to patient by phone today as scheduled by Care Guide  Review of patient status, including review of consultants reports, laboratory and other test data, was performed as part of comprehensive evaluation and provision of chronic care management services.    Objective:  Lab Results  Component Value Date   CREATININE 0.94 03/24/2018   CREATININE 0.84 04/09/2016   CREATININE 0.98 01/09/2016    Lab Results  Component Value Date   HGBA1C 5.8 (A) 07/16/2018       Component Value Date/Time   CHOL 121 04/09/2016 0001   CHOL 132 03/16/2015 1412   TRIG 103 04/09/2016 0001   HDL 27 (L) 04/09/2016 0001   HDL 30 (L) 03/16/2015 1412   CHOLHDL 4.5 04/09/2016 0001   VLDL 21 04/09/2016 0001   LDLCALC 73 04/09/2016 0001   LDLCALC 83 03/16/2015 1412     BP Readings from Last 3 Encounters:  04/01/18 111/62  01/14/18 (!) 147/70  12/16/17 (!) 156/76    Allergies  Allergen Reactions  . Iodine Hives    Medications Reviewed Today    Reviewed by Jonathan Faulkner, [facility] (Pharmacist) on 11/16/18 at 1447  Med List Status: <None>  Medication Order Taking? Sig Documenting Provider Last Dose Status Informant  amLODipine (NORVASC) 5 MG tablet [id number] Yes Take 1 tablet (5 mg total) by mouth daily. Jonathan Hauser, DO Taking Active   atorvastatin (LIPITOR) 10 MG tablet [id number] No TAKE ONE (1) TABLET BY MOUTH EVERY DAY AT SIX IN THE EVENING  Patient not taking: Reported on 11/16/2018   Jonathan Faulkner., MD Not Taking Active Self  losartan (COZAAR) 100 MG tablet [id number] Yes Take 1 tablet (100 mg total) by mouth daily. TAKE 1 TABLET DAILY.  Jonathan Hauser, DO Taking Active   metFORMIN (GLUCOPHAGE-XR) 500 MG 24 hr tablet [id number] Yes Take 2 tablets (1,000 mg total) by mouth daily with breakfast.  Patient taking differently: Take 500 mg by mouth daily with breakfast.    Jonathan Hauser, DO Taking Active   naproxen (NAPROSYN) 500 MG tablet [id number] No TAKE (1) TABLET TWICE A DAY.  Patient not taking: No sig reported   Jonathan Hauser, DO Not Taking Active Self  omeprazole (PRILOSEC) 20 MG capsule [id number] Yes TAKE (1) CAPSULE TWICE DAILY BEFORE A MEAL.  Patient taking differently: 20 mg every morning. TAKE (1) CAPSULE TWICE DAILY BEFORE A MEAL.   Jonathan Hauser, DO Taking Active Self  Semaglutide,0.25 or 0.5MG /DOS, (OZEMPIC, 0.25 OR 0.5 MG/DOSE,) 2 MG/1.5ML SOPN [id number] Yes Inject 0.5 mg into the skin every Saturday.  Patient taking differently: Inject 0.5 mg into the skin every Sunday.    Jonathan Hauser, DO Taking Active            Assessment:   Goals Addressed            This Visit's Progress   . PharmD - Medication Review       Current Barriers:  . Patient with type 2 diabetes mellitus not currently on statin for reduction of ASCVD risk  Pharmacist Clinical Goal(s):  Marland Kitchen Over the next 30 days, patient will work  with CM Pharmacist and PCP to address barriers to statin therapy for Jonathan Faulkner  Interventions: . Assess adherence to statin (as identified by quality team/health plan).  o Per chart, patient previously on atorvastatin, but stopped in Nov 2018 without clear reason or side effect. Has remained on medication list, but not renewed or refilled. o Counsel patient on benefit of taking statin medication for ASCVD risk reduction, particularly given patient with type 2 diabetes mellitus o Most recent lipid panel in chart from 04/09/2016 . Patient due for annual physical and lab work with PCP o Jonathan Faulkner calls today to schedule this appointment o Agreeable to  discussing restart of statin with provider at this appointment . Comprehensive medication review performed; medication list updated in electronic medical record . Reports he has been taking OTC naproxen 220mg  - 2 tablets each morning prn since he ran out of refills on his naproxen prescription. o Reports typically uses ~3-4x/week for back and muscle pain o Discuss using naproxen as needed and taking with food . Counsel on importance of blood sugar control o Reports taking:  - Ozempic 0.5 mg weekly (on Sundays) - Metformin ER 500 mg - 1 tablet daily with breakfast.  . Reports self-decreased his metformin at the beginning of this year when he started on Ozempic, as he was having more stomach upset with the 2 tablets daily. - Denies recently checking blood sugar at home. Confirms having monitor . Denies currently checking blood pressure at home . Patient interested in obtaining flu vaccine at upcoming appointment  Patient Self Care Activities:  . Attends all scheduled provider appointments . Calls pharmacy for medication refills . Calls provider office for new concerns or questions   Initial goal documentation        Plan:  The patient has been provided with contact information for CM Pharmacist and has been advised to call with any medication related questions or concerns.   Jonathan Faulkner, PharmD, [place] [employer] [phone]

## 2018-11-23 ENCOUNTER — Ambulatory Visit (INDEPENDENT_AMBULATORY_CARE_PROVIDER_SITE_OTHER): Payer: PPO | Admitting: Family Medicine

## 2018-11-23 ENCOUNTER — Other Ambulatory Visit: Payer: Self-pay

## 2018-11-23 ENCOUNTER — Encounter: Payer: Self-pay | Admitting: Family Medicine

## 2018-11-23 VITALS — BP 113/68 | HR 68 | Temp 98.5°F | Resp 16 | Ht 72.0 in | Wt 191.0 lb

## 2018-11-23 DIAGNOSIS — E1169 Type 2 diabetes mellitus with other specified complication: Secondary | ICD-10-CM | POA: Diagnosis not present

## 2018-11-23 DIAGNOSIS — Z0000 Encounter for general adult medical examination without abnormal findings: Secondary | ICD-10-CM | POA: Diagnosis not present

## 2018-11-23 DIAGNOSIS — E663 Overweight: Secondary | ICD-10-CM

## 2018-11-23 DIAGNOSIS — E785 Hyperlipidemia, unspecified: Secondary | ICD-10-CM | POA: Diagnosis not present

## 2018-11-23 DIAGNOSIS — Z23 Encounter for immunization: Secondary | ICD-10-CM | POA: Diagnosis not present

## 2018-11-23 DIAGNOSIS — Z1211 Encounter for screening for malignant neoplasm of colon: Secondary | ICD-10-CM

## 2018-11-23 NOTE — Progress Notes (Signed)
Subjective:    [name], male    DOB: 1950/08/04, 67 y.o.   MRN: [id number]  Jonathan Faulkner is a 68 y.o. male presenting on 11/23/2018 for Annual Exam   HPI   Here for Annual Physical and due for fasting labs, return tomorrow.  CHRONIC DM, Type 2 / Overweight BMI >25 - Last visit with me 07/2018, treated with continued ozempic with improvement on A1c and wt loss reduced appetite, see prior notes for background information. - Due for A1c lab will return this week - Today patient reports same concern as before with wt loss and reduced appetite, he is not too concerned wants to keep med, but reporting it, and may consider dose adjustment CBGs: No CBG detailed readings today Meds: Ozempic 0.5mg  weekly injection, Metformin XR 1000mg  daily Reports good compliance. Tolerating well w/o side-effects Currently on ARB Lifestyle: - Weight down to 191 lbs - Diet (balanced, improving, reduced portions)  Denies hypoglycemia, polyuria, visual changes, numbness or tingling.  CHRONIC HTN: .Reports no new concern Current Meds - amlodipine 5mg  daily, losartan 100mg    Reports good compliance, took meds today. Tolerating well, w/o complaints. Denies CP, dyspnea, HA, edema, dizziness / lightheadedness   Health Maintenance: Next Pneumonia vaccine - 01/15/2019 or later. DUe for DM Eye Exam.  Depression screen [facility] 2/9 11/23/2018 07/06/2018 04/01/2018  Decreased Interest 0 0 0  Down, Depressed, Hopeless 0 0 0  PHQ - 2 Score 0 0 0    Past Medical History:  Diagnosis Date  . GERD (gastroesophageal reflux disease)   . Hypercholesterolemia   . Hypertension   . Measles    hx  . Varicella without mention of complication    hx   Past Surgical History:  Procedure Laterality Date  . None     Social History   Socioeconomic History  . Marital status: Married    Spouse name: Not on file  . Number of children: Not on file  . Years of education: Not on file  . Highest  education level: Not on file  Occupational History  . Not on file  Social Needs  . Financial resource strain: Not on file  . Food insecurity    Worry: Not on file    Inability: Not on file  . Transportation needs    Medical: Not on file    Non-medical: Not on file  Tobacco Use  . Smoking status: Current Every Day Smoker    Years: 47.00    Types: Cigars  . Smokeless tobacco: Current User  . Tobacco comment: 3-4 day  Substance and Sexual Activity  . Alcohol use: No  . Drug use: No  . Sexual activity: Yes  Lifestyle  . Physical activity    Days per week: Not on file    Minutes per session: Not on file  . Stress: Not on file  Relationships  . Social Herbalist on phone: Not on file    Gets together: Not on file    Attends religious service: Not on file    Active member of club or organization: Not on file    Attends meetings of clubs or organizations: Not on file    Relationship status: Not on file  . Intimate partner violence    Fear of current or ex partner: Not on file    Emotionally abused: Not on file    Physically abused: Not on file    Forced sexual activity: Not on file  Other Topics Concern  . Not on file  Social History Narrative  . Not on file   Family History  Adopted: Yes  Family history unknown: Yes   Current Outpatient Medications on File Prior to Visit  Medication Sig  . amLODipine (NORVASC) 5 MG tablet Take 1 tablet (5 mg total) by mouth daily.  Marland Kitchen losartan (COZAAR) 100 MG tablet Take 1 tablet (100 mg total) by mouth daily. TAKE 1 TABLET DAILY.  . metFORMIN (GLUCOPHAGE-XR) 500 MG 24 hr tablet Take 2 tablets (1,000 mg total) by mouth daily with breakfast. (Patient taking differently: Take 500 mg by mouth daily with breakfast. )  . naproxen (NAPROSYN) 500 MG tablet TAKE (1) TABLET TWICE A DAY. (Patient taking differently: Take 440 mg by mouth every morning. TAKE (1) TABLET TWICE A DAY.)  . omeprazole (PRILOSEC) 20 MG capsule TAKE (1) CAPSULE  TWICE DAILY BEFORE A MEAL. (Patient taking differently: 20 mg every morning. TAKE (1) CAPSULE TWICE DAILY BEFORE A MEAL.)  . Semaglutide,0.25 or 0.5MG /DOS, (OZEMPIC, 0.25 OR 0.5 MG/DOSE,) 2 MG/1.5ML SOPN Inject 0.5 mg into the skin every Saturday. (Patient taking differently: Inject 0.5 mg into the skin every Sunday. )   No current facility-administered medications on file prior to visit.     Review of Systems  Constitutional: Negative for activity change, appetite change, chills, diaphoresis, fatigue and fever.  HENT: Negative for congestion and hearing loss.   Eyes: Negative for visual disturbance.  Respiratory: Negative for cough, chest tightness, shortness of breath and wheezing.   Cardiovascular: Negative for chest pain, palpitations and leg swelling.  Gastrointestinal: Negative for abdominal pain, constipation, diarrhea, nausea and vomiting.  Endocrine: Negative for cold intolerance.  Genitourinary: Negative for decreased urine volume, dysuria, frequency, hematuria, testicular pain and urgency.  Musculoskeletal: Negative for arthralgias, back pain and neck pain.  Skin: Negative for rash.  Allergic/Immunologic: Negative for environmental allergies.  Neurological: Negative for dizziness, weakness, light-headedness, numbness and headaches.  Hematological: Negative for adenopathy.  Psychiatric/Behavioral: Negative for behavioral problems, dysphoric mood and sleep disturbance. The patient is not nervous/anxious.    Per HPI unless specifically indicated above       Objective:    BP 113/68   Pulse 68   Temp 98.5 F (36.9 C) (Oral)   Resp 16   Ht 6' (1.829 m)   Wt 191 lb (86.6 kg)   BMI 25.90 kg/m   Wt Readings from Last 3 Encounters:  11/23/18 191 lb (86.6 kg)  07/06/18 193 lb (87.5 kg)  04/01/18 205 lb (93 kg)    Physical Exam Vitals signs and nursing note reviewed.  Constitutional:      General: He is not in acute distress.    Appearance: He is well-developed. He is  not diaphoretic.     Comments: Well-appearing, comfortable, cooperative  HENT:     Head: Normocephalic and atraumatic.  Eyes:     General:        Right eye: No discharge.        Left eye: No discharge.     Conjunctiva/sclera: Conjunctivae normal.     Pupils: Pupils are equal, round, and reactive to light.  Neck:     Musculoskeletal: Normal range of motion and neck supple.     Thyroid: No thyromegaly.  Cardiovascular:     Rate and Rhythm: Normal rate and regular rhythm.     Heart sounds: Normal heart sounds. No murmur.  Pulmonary:     Effort: Pulmonary effort is normal. No respiratory distress.  Breath sounds: Normal breath sounds. No wheezing or rales.  Abdominal:     General: Bowel sounds are normal. There is no distension.     Palpations: Abdomen is soft. There is no mass.     Tenderness: There is no abdominal tenderness.  Musculoskeletal: Normal range of motion.        General: No tenderness.     Comments: Upper / Lower Extremities: - Normal muscle tone, strength bilateral upper extremities 5/5, lower extremities 5/5  Lymphadenopathy:     Cervical: No cervical adenopathy.  Skin:    General: Skin is warm and dry.     Findings: No erythema or rash.  Neurological:     Mental Status: He is alert and oriented to person, place, and time.     Comments: Distal sensation intact to light touch all extremities  Psychiatric:        Behavior: Behavior normal.     Comments: Well groomed, good eye contact, normal speech and thoughts      Diabetic Foot Exam - Simple   Simple Foot Form Diabetic Foot exam was performed with the following findings: Yes 11/23/2018  3:04 PM  Visual Inspection No deformities, no ulcerations, no other skin breakdown bilaterally: Yes Sensation Testing Intact to touch and monofilament testing bilaterally: Yes Pulse Check Posterior Tibialis and Dorsalis pulse intact bilaterally: Yes Comments      Results for orders placed or performed in visit on  07/16/18  POCT glycosylated hemoglobin (Hb A1C)  Result Value Ref Range   Hemoglobin A1C 5.8 (A) 4.0 - 5.6 %   HbA1c POC (<> result, manual entry)     HbA1c, POC (prediabetic range)     HbA1c, POC (controlled diabetic range)        Assessment & Plan:   Problem List Items Addressed This Visit    Type 2 diabetes mellitus with other specified complication ([place])   Hyperlipidemia associated with type 2 diabetes mellitus ([place])    Other Visit Diagnoses    Annual physical exam    -  Primary   Screening for colon cancer       Relevant Orders   Cologuard   Needs flu shot       Relevant Orders   Flu Vaccine QUAD High Dose(Fluad) (Completed)      Updated Health Maintenance information Return for fasting labs this week, tomorrow, review results follow-up w/ patient via phone Encouraged improvement to lifestyle with diet and exercise - Goal of weight loss  Due for routine colon cancer screening. - Discussion today about recommendations for either Colonoscopy or Cologuard screening, benefits and risks of screening, interested in Cologuard, understands that if positive then recommendation is for diagnostic colonoscopy to follow-up. - Ordered Cologuard today  #T2DM Previuosly controlled Continue Ozempic GLP1 0.5mg  weekly inj for now, future may consider reduce dose back to 0.25mg  if too much appetite suppression, can reconsider in future. Or switch agent. DM Foot exam done. Follow-up as planned  No orders of the defined types were placed in this encounter.   Follow up plan: Return in about 1 day (around 11/24/2018) for fasting lab only .  Labs already on file. Ready to be released  Return 6 months  Nobie Putnam, [facility] 11/23/2018, 2:45 PM

## 2018-11-23 NOTE — Patient Instructions (Addendum)
Thank you for coming to the office today.  We will get blood draw this week. Stay tuned for results by phone A1c. If needed we can adjust dose lower to 0.25mg  weekly Ozempic OR we can consider switch product  Reminder to schedule with Dr Matilde Sprang for your Annual Diabetic Eye Exam.  Cologuard Ordered.   DUE for FASTING BLOOD WORK (no food or drink after midnight before the lab appointment, only water or coffee without cream/sugar on the morning of)  SCHEDULE "Lab Only" visit in the morning at the clinic for lab draw this week as soon as able to return  For Lab Results, once available within 2-3 days of blood draw, you can can log in to MyChart online to view your results and a brief explanation  Please schedule a Follow-up Appointment to: Return in about 1 day (around 11/24/2018) for fasting lab only .  If you have any other questions or concerns, please feel free to call the office or send a message through [place]. You may also schedule an earlier appointment if necessary.  Additionally, you may be receiving a survey about your experience at our office within a few days to 1 week by e-mail or mail. We value your feedback.  Nobie Putnam, DO [place]

## 2018-11-24 ENCOUNTER — Other Ambulatory Visit: Payer: PPO

## 2018-11-25 ENCOUNTER — Other Ambulatory Visit: Payer: Self-pay

## 2018-11-25 DIAGNOSIS — Z0000 Encounter for general adult medical examination without abnormal findings: Secondary | ICD-10-CM | POA: Diagnosis not present

## 2018-11-25 DIAGNOSIS — E785 Hyperlipidemia, unspecified: Secondary | ICD-10-CM

## 2018-11-25 DIAGNOSIS — Z114 Encounter for screening for human immunodeficiency virus [HIV]: Secondary | ICD-10-CM

## 2018-11-25 DIAGNOSIS — I10 Essential (primary) hypertension: Secondary | ICD-10-CM

## 2018-11-25 DIAGNOSIS — Z125 Encounter for screening for malignant neoplasm of prostate: Secondary | ICD-10-CM | POA: Diagnosis not present

## 2018-11-25 DIAGNOSIS — Z1159 Encounter for screening for other viral diseases: Secondary | ICD-10-CM | POA: Diagnosis not present

## 2018-11-25 DIAGNOSIS — E1169 Type 2 diabetes mellitus with other specified complication: Secondary | ICD-10-CM

## 2018-11-26 LAB — HEPATITIS C ANTIBODY
Hepatitis C Ab: NONREACTIVE
SIGNAL TO CUT-OFF: 0.28 (ref <1.00)

## 2018-11-26 LAB — COMPLETE METABOLIC PANEL WITH GFR
AG Ratio: 1.7 (calc) (ref 1.0–2.5)
ALT: 30 U/L (ref 9–46)
AST: 20 U/L (ref 10–35)
Albumin: 4.2 g/dL (ref 3.6–5.1)
Alkaline phosphatase (APISO): 61 U/L (ref 35–144)
BUN: 12 mg/dL (ref 7–25)
CO2: 26 mmol/L (ref 20–32)
Calcium: 9.7 mg/dL (ref 8.6–10.3)
Chloride: 104 mmol/L (ref 98–110)
Creat: 0.79 mg/dL (ref 0.70–1.25)
GFR, Est African American: 108 mL/min/{1.73_m2} (ref >=60)
GFR, Est Non African American: 94 mL/min/{1.73_m2} (ref >=60)
Globulin: 2.5 g/dL (calc) (ref 1.9–3.7)
Glucose, Bld: 94 mg/dL (ref 65–99)
Potassium: 4.6 mmol/L (ref 3.5–5.3)
Sodium: 137 mmol/L (ref 135–146)
Total Bilirubin: 0.6 mg/dL (ref 0.2–1.2)
Total Protein: 6.7 g/dL (ref 6.1–8.1)

## 2018-11-26 LAB — CBC WITH DIFFERENTIAL/PLATELET
Absolute Monocytes: 770 cells/uL (ref 200–950)
Basophils Absolute: 59 cells/uL (ref 0–200)
Basophils Relative: 0.8 %
Eosinophils Absolute: 348 cells/uL (ref 15–500)
Eosinophils Relative: 4.7 %
HCT: 51.1 % — ABNORMAL HIGH (ref 38.5–50.0)
Hemoglobin: 17.5 g/dL — ABNORMAL HIGH (ref 13.2–17.1)
Lymphs Abs: 1880 cells/uL (ref 850–3900)
MCH: 32.3 pg (ref 27.0–33.0)
MCHC: 34.2 g/dL (ref 32.0–36.0)
MCV: 94.5 fL (ref 80.0–100.0)
MPV: 9.9 fL (ref 7.5–12.5)
Monocytes Relative: 10.4 %
Neutro Abs: 4344 cells/uL (ref 1500–7800)
Neutrophils Relative %: 58.7 %
Platelets: 230 10*3/uL (ref 140–400)
RBC: 5.41 10*6/uL (ref 4.20–5.80)
RDW: 13.5 % (ref 11.0–15.0)
Total Lymphocyte: 25.4 %
WBC: 7.4 10*3/uL (ref 3.8–10.8)

## 2018-11-26 LAB — HEMOGLOBIN A1C
Hgb A1c MFr Bld: 6 % of total Hgb — ABNORMAL HIGH (ref <5.7)
Mean Plasma Glucose: 126 (calc)
eAG (mmol/L): 7 (calc)

## 2018-11-26 LAB — LIPID PANEL
Cholesterol: 140 mg/dL (ref <200)
HDL: 29 mg/dL — ABNORMAL LOW (ref >=40)
LDL Cholesterol (Calc): 93 mg/dL (calc)
Non-HDL Cholesterol (Calc): 111 mg/dL (calc) (ref <130)
Total CHOL/HDL Ratio: 4.8 (calc) (ref <5.0)
Triglycerides: 86 mg/dL (ref <150)

## 2018-11-26 LAB — HIV ANTIBODY (ROUTINE TESTING W REFLEX): HIV 1&2 Ab, 4th Generation: NONREACTIVE

## 2018-11-26 LAB — PSA: PSA: 0.6 ng/mL (ref <=4.0)

## 2018-11-26 LAB — TSH: TSH: 2.06 mIU/L (ref 0.40–4.50)

## 2018-11-27 ENCOUNTER — Other Ambulatory Visit: Payer: Self-pay | Admitting: Family Medicine

## 2018-11-27 DIAGNOSIS — E1169 Type 2 diabetes mellitus with other specified complication: Secondary | ICD-10-CM

## 2018-11-27 MED ORDER — ATORVASTATIN CALCIUM 10 MG PO TABS: 10.0000 mg | ORAL_TABLET | Freq: Every day | ORAL | 3 refills | Status: DC

## 2018-11-27 MED ORDER — OZEMPIC (0.25 OR 0.5 MG/DOSE) 2 MG/1.5ML ~~LOC~~ SOPN: 0.2500 mg | PEN_INJECTOR | SUBCUTANEOUS | 3 refills | Status: DC

## 2018-12-08 ENCOUNTER — Other Ambulatory Visit: Payer: Self-pay | Admitting: Family Medicine

## 2018-12-08 DIAGNOSIS — K219 Gastro-esophageal reflux disease without esophagitis: Secondary | ICD-10-CM

## 2018-12-24 DIAGNOSIS — Z1212 Encounter for screening for malignant neoplasm of rectum: Secondary | ICD-10-CM | POA: Diagnosis not present

## 2018-12-24 DIAGNOSIS — Z1211 Encounter for screening for malignant neoplasm of colon: Secondary | ICD-10-CM | POA: Diagnosis not present

## 2018-12-26 LAB — COLOGUARD: Cologuard: POSITIVE — AB

## 2019-01-06 ENCOUNTER — Telehealth: Payer: Self-pay | Admitting: Family Medicine

## 2019-01-06 NOTE — Telephone Encounter (Signed)
Received cologuard test result - it is POSITIVE (1st colorectal ca screen test)  Called patient to discuss  Reviewed the following conversation with her:  This is an abnormal result. And it means that we need more testing to determine if there is a problem with your colon.  I do not want you to be alarmed. Most of the time we get a positive Cologuard result, usually it is due to a Polyp that is either benign or possibly pre-cancer that is caught very early and is treatable before it ever turns into a problem or cancer.  The next step is a Colonoscopy procedure to look for the abnormality or polyp and remove and treat it. We would offer this by referring you to a GI specialist locally.  Discussed concerns with COVID risk now, and we mutually agree that it would be acceptable to wait 1-3 months prior to ordering this referral. He will contact us early March 2021 to be referred to [facility] locally for pursuing colonoscopy. He will also schedule apt in March 2021 for his routine follow-up.  If he decides to pursue referral sooner we will order it. He will let us know.  Nobie Putnam, DO [facility] 01/06/2019, 6:15 PM

## 2019-01-15 ENCOUNTER — Other Ambulatory Visit: Payer: Self-pay

## 2019-01-15 ENCOUNTER — Telehealth: Payer: Self-pay

## 2019-01-15 NOTE — Telephone Encounter (Signed)
Open in error

## 2019-03-16 ENCOUNTER — Ambulatory Visit (INDEPENDENT_AMBULATORY_CARE_PROVIDER_SITE_OTHER): Payer: PPO

## 2019-03-16 VITALS — Ht 72.0 in | Wt 189.0 lb

## 2019-03-16 DIAGNOSIS — Z0000 Encounter for general adult medical examination without abnormal findings: Secondary | ICD-10-CM

## 2019-03-16 NOTE — Patient Instructions (Signed)
Jonathan Faulkner , Thank you for taking time to come for your Medicare Wellness Visit. I appreciate your ongoing commitment to your health goals. Please review the following plan we discussed and let me know if I can assist you in the future.   Screening recommendations/referrals: Colonoscopy: waiting on colonoscopy for 04/2019 Recommended yearly ophthalmology/optometry visit for glaucoma screening and checkup Recommended yearly dental visit for hygiene and checkup  Vaccinations: Influenza vaccine: up to date Pneumococcal vaccine: booster due Tdap vaccine: due now  Shingles vaccine: shingrix eligible   Advanced directives: Please bring a copy of your health care power of attorney and living will to the office at your convenience.  Conditions/risks identified: diabetic eye exam due please schedule whenever you feel comfortable going back into office.   Next appointment: follow up in one year for your annual wellness visit   Preventive Care 3 Years and Older, Male Preventive care refers to lifestyle choices and visits with your health care provider that can promote health and wellness. What does preventive care include?  A yearly physical exam. This is also called an annual well check.  Dental exams once or twice a year.  Routine eye exams. Ask your health care provider how often you should have your eyes checked.  Personal lifestyle choices, including:  Daily care of your teeth and gums.  Regular physical activity.  Eating a healthy diet.  Avoiding tobacco and drug use.  Limiting alcohol use.  Practicing safe sex.  Taking low doses of aspirin every day.  Taking vitamin and mineral supplements as recommended by your health care provider. What happens during an annual well check? The services and screenings done by your health care provider during your annual well check will depend on your age, overall health, lifestyle risk factors, and family history of disease. Counseling    Your health care provider may ask you questions about your:  Alcohol use.  Tobacco use.  Drug use.  Emotional well-being.  Home and relationship well-being.  Sexual activity.  Eating habits.  History of falls.  Memory and ability to understand (cognition).  Work and work Statistician. Screening  You may have the following tests or measurements:  Height, weight, and BMI.  Blood pressure.  Lipid and cholesterol levels. These may be checked every 5 years, or more frequently if you are over 29 years old.  Skin check.  Lung cancer screening. You may have this screening every year starting at age 15 if you have a 30-pack-year history of smoking and currently smoke or have quit within the past 15 years.  Fecal occult blood test (FOBT) of the stool. You may have this test every year starting at age 79.  Flexible sigmoidoscopy or colonoscopy. You may have a sigmoidoscopy every 5 years or a colonoscopy every 10 years starting at age 36.  Prostate cancer screening. Recommendations will vary depending on your family history and other risks.  Hepatitis C blood test.  Hepatitis B blood test.  Sexually transmitted disease (STD) testing.  Diabetes screening. This is done by checking your blood sugar (glucose) after you have not eaten for a while (fasting). You may have this done every 1-3 years.  Abdominal aortic aneurysm (AAA) screening. You may need this if you are a current or former smoker.  Osteoporosis. You may be screened starting at age 75 if you are at high risk. Talk with your health care provider about your test results, treatment options, and if necessary, the need for more tests. Vaccines  Your  health care provider may recommend certain vaccines, such as:  Influenza vaccine. This is recommended every year.  Tetanus, diphtheria, and acellular pertussis (Tdap, Td) vaccine. You may need a Td booster every 10 years.  Zoster vaccine. You may need this after age  9.  Pneumococcal 13-valent conjugate (PCV13) vaccine. One dose is recommended after age 54.  Pneumococcal polysaccharide (PPSV23) vaccine. One dose is recommended after age 68. Talk to your health care provider about which screenings and vaccines you need and how often you need them. This information is not intended to replace advice given to you by your health care provider. Make sure you discuss any questions you have with your health care provider. Document Released: 02/17/2015 Document Revised: 10/11/2015 Document Reviewed: 11/22/2014 Elsevier Interactive Patient Education  2017 [place] Prevention in the Home Falls can cause injuries. They can happen to people of all ages. There are many things you can do to make your home safe and to help prevent falls. What can I do on the outside of my home?  Regularly fix the edges of walkways and driveways and fix any cracks.  Remove anything that might make you trip as you walk through a door, such as a raised step or threshold.  Trim any bushes or trees on the path to your home.  Use bright outdoor lighting.  Clear any walking paths of anything that might make someone trip, such as rocks or tools.  Regularly check to see if handrails are loose or broken. Make sure that both sides of any steps have handrails.  Any raised decks and porches should have guardrails on the edges.  Have any leaves, snow, or ice cleared regularly.  Use sand or salt on walking paths during winter.  Clean up any spills in your garage right away. This includes oil or grease spills. What can I do in the bathroom?  Use night lights.  Install grab bars by the toilet and in the tub and shower. Do not use towel bars as grab bars.  Use non-skid mats or decals in the tub or shower.  If you need to sit down in the shower, use a plastic, non-slip stool.  Keep the floor dry. Clean up any water that spills on the floor as soon as it happens.  Remove  soap buildup in the tub or shower regularly.  Attach bath mats securely with double-sided non-slip rug tape.  Do not have throw rugs and other things on the floor that can make you trip. What can I do in the bedroom?  Use night lights.  Make sure that you have a light by your bed that is easy to reach.  Do not use any sheets or blankets that are too big for your bed. They should not hang down onto the floor.  Have a firm chair that has side arms. You can use this for support while you get dressed.  Do not have throw rugs and other things on the floor that can make you trip. What can I do in the kitchen?  Clean up any spills right away.  Avoid walking on wet floors.  Keep items that you use a lot in easy-to-reach places.  If you need to reach something above you, use a strong step stool that has a grab bar.  Keep electrical cords out of the way.  Do not use floor polish or wax that makes floors slippery. If you must use wax, use non-skid floor wax.  Do not  have throw rugs and other things on the floor that can make you trip. What can I do with my stairs?  Do not leave any items on the stairs.  Make sure that there are handrails on both sides of the stairs and use them. Fix handrails that are broken or loose. Make sure that handrails are as long as the stairways.  Check any carpeting to make sure that it is firmly attached to the stairs. Fix any carpet that is loose or worn.  Avoid having throw rugs at the top or bottom of the stairs. If you do have throw rugs, attach them to the floor with carpet tape.  Make sure that you have a light switch at the top of the stairs and the bottom of the stairs. If you do not have them, ask someone to add them for you. What else can I do to help prevent falls?  Wear shoes that:  Do not have high heels.  Have rubber bottoms.  Are comfortable and fit you well.  Are closed at the toe. Do not wear sandals.  If you use a  stepladder:  Make sure that it is fully opened. Do not climb a closed stepladder.  Make sure that both sides of the stepladder are locked into place.  Ask someone to hold it for you, if possible.  Clearly mark and make sure that you can see:  Any grab bars or handrails.  First and last steps.  Where the edge of each step is.  Use tools that help you move around (mobility aids) if they are needed. These include:  Canes.  Walkers.  Scooters.  Crutches.  Turn on the lights when you go into a dark area. Replace any light bulbs as soon as they burn out.  Set up your furniture so you have a clear path. Avoid moving your furniture around.  If any of your floors are uneven, fix them.  If there are any pets around you, be aware of where they are.  Review your medicines with your doctor. Some medicines can make you feel dizzy. This can increase your chance of falling. Ask your doctor what other things that you can do to help prevent falls. This information is not intended to replace advice given to you by your health care provider. Make sure you discuss any questions you have with your health care provider. Document Released: 11/17/2008 Document Revised: 06/29/2015 Document Reviewed: 02/25/2014 Elsevier Interactive Patient Education  2017 [employer].

## 2019-03-16 NOTE — Progress Notes (Signed)
Subjective:   Jonathan Faulkner is a 69 y.o. male who presents for an Initial Medicare Annual Wellness Visit.  This visit is being conducted via phone call  - after an attmept to do on video chat - due to the COVID-19 pandemic. This patient has given me verbal consent via phone to conduct this visit, patient states they are participating from their home address. Some vital signs may be absent or patient reported.   Patient identification: identified by name, DOB, and current address.    Review of Systems   Cardiac Risk Factors include: advanced age (>62men, >72 women);dyslipidemia;diabetes mellitus;hypertension;male gender    Objective:    Today's Vitals   03/16/19 1500  Weight: 189 lb (85.7 kg)  Height: 6' (1.829 m)   Body mass index is 25.63 kg/m.  Advanced Directives 03/16/2019 12/02/2014  Does Patient Have a Medical Advance Directive? Yes No;Yes  Type of Advance Directive Living will;Healthcare Power of Attorney Living will  Copy of [place] in Chart? No - copy requested No - copy requested    Current Medications (verified) Outpatient Encounter Medications as of 03/16/2019  Medication Sig  . amLODipine (NORVASC) 5 MG tablet Take 1 tablet (5 mg total) by mouth daily.  Marland Kitchen atorvastatin (LIPITOR) 10 MG tablet Take 1 tablet (10 mg total) by mouth at bedtime.  Marland Kitchen losartan (COZAAR) 100 MG tablet Take 1 tablet (100 mg total) by mouth daily. TAKE 1 TABLET DAILY.  . metFORMIN (GLUCOPHAGE-XR) 500 MG 24 hr tablet Take 2 tablets (1,000 mg total) by mouth daily with breakfast. (Patient taking differently: Take 500 mg by mouth daily with breakfast. )  . naproxen (NAPROSYN) 500 MG tablet TAKE (1) TABLET TWICE A DAY. (Patient taking differently: Take 440 mg by mouth every morning. TAKE (1) TABLET TWICE A DAY.)  . omeprazole (PRILOSEC) 20 MG capsule TAKE (1) CAPSULE TWICE DAILY BEFORE A MEAL.  . Semaglutide,0.25 or 0.5MG /DOS, (OZEMPIC, 0.25 OR 0.5 MG/DOSE,) 2 MG/1.5ML SOPN  Inject 0.25 mg into the skin once a week.   No facility-administered encounter medications on file as of 03/16/2019.    Allergies (verified) Iodine   History: Past Medical History:  Diagnosis Date  . GERD (gastroesophageal reflux disease)   . Hypercholesterolemia   . Hypertension   . Measles    hx  . Varicella without mention of complication    hx   Past Surgical History:  Procedure Laterality Date  . None     Family History  Adopted: Yes  Family history unknown: Yes   Social History   Socioeconomic History  . Marital status: Married    Spouse name: Not on file  . Number of children: Not on file  . Years of education: Not on file  . Highest education level: Not on file  Occupational History  . Occupation: retired   Tobacco Use  . Smoking status: Current Every Day Smoker    Years: 47.00    Types: Cigars  . Smokeless tobacco: Never Used  . Tobacco comment: 3-4 day  Substance and Sexual Activity  . Alcohol use: No  . Drug use: No  . Sexual activity: Yes  Other Topics Concern  . Not on file  Social History Narrative  . Not on file   Social Determinants of Health   Financial Resource Strain:   . Difficulty of Paying Living Expenses: Not on file  Food Insecurity:   . Worried About Charity fundraiser in the Last Year: Not on file  .  Ran Out of Food in the Last Year: Not on file  Transportation Needs:   . Lack of Transportation (Medical): Not on file  . Lack of Transportation (Non-Medical): Not on file  Physical Activity:   . Days of Exercise per Week: Not on file  . Minutes of Exercise per Session: Not on file  Stress:   . Feeling of Stress : Not on file  Social Connections:   . Frequency of Communication with Friends and Family: Not on file  . Frequency of Social Gatherings with Friends and Family: Not on file  . Attends Religious Services: Not on file  . Active Member of Clubs or Organizations: Not on file  . Attends Archivist Meetings: Not  on file  . Marital Status: Not on file   Tobacco Counseling Ready to quit: Not Answered Counseling given: Not Answered Comment: 3-4 day   Clinical Intake:  Pre-visit preparation completed: Yes  Pain : No/denies pain     Nutritional Status: BMI 25 -29 Overweight Nutritional Risks: None Diabetes: Yes CBG done?: No Did pt. bring in CBG monitor from home?: No  How often do you need to have someone help you when you read instructions, pamphlets, or other written materials from your doctor or pharmacy?: 1 - Never  Interpreter Needed?: No  Information entered by :: Naveah Brave,LPN  Activities of Daily Living In your present state of health, do you have any difficulty performing the following activities: 03/16/2019 11/23/2018  Hearing? Y N  Comment hearing aids -  Vision? N N  Comment eyeglasses, Dr.nice annually -  Difficulty concentrating or making decisions? N N  Walking or climbing stairs? N N  Dressing or bathing? N N  Doing errands, shopping? N N  Preparing Food and eating ? N -  Using the Toilet? N -  In the past six months, have you accidently leaked urine? N -  Do you have problems with loss of bowel control? N -  Managing your Medications? N -  Managing your Finances? N -  Housekeeping or managing your Housekeeping? N -  Some recent data might be hidden     Immunizations and Health Maintenance Immunization History  Administered Date(s) Administered  . Fluad Quad(high Dose 65+) 11/23/2018  . Influenza, High Dose Seasonal PF 12/16/2017  . Influenza,inj,Quad PF,6+ Mos 12/02/2014, 01/02/2016, 12/10/2016  . Pneumococcal Conjugate-13 01/14/2018   Health Maintenance Due  Topic Date Due  . OPHTHALMOLOGY EXAM  11/23/2018  . PNA vac Low Risk Adult (2 of 2 - PPSV23) 01/15/2019    Patient Care Team: Olin Hauser, DO as PCP - General (Family Medicine) [place], Virl Diamond, [facility] as Pharmacist  Indicate any recent Medical Services you may have received  from other than [facility] providers in the past year (date may be approximate).    Assessment:   This is a routine wellness examination for Jonathan Faulkner.  Hearing/Vision screen No exam data present  Dietary issues and exercise activities discussed: Current Exercise Habits: The patient does not participate in regular exercise at present, Exercise limited by: None identified  Goals    . PharmD - Medication Review     Current Barriers:  . Patient with type 2 diabetes mellitus not currently on statin for reduction of ASCVD risk  Pharmacist Clinical Goal(s):  Marland Kitchen Over the next 30 days, patient will work with CM Pharmacist and PCP to address barriers to statin therapy for Mr. Mackiewicz  Interventions: . Assess adherence to statin (as identified by quality team/health  plan).  o Per chart, patient previously on atorvastatin, but stopped in Nov 2018 without clear reason or side effect. Has remained on medication list, but not renewed or refilled. o Counsel patient on benefit of taking statin medication for ASCVD risk reduction, particularly given patient with type 2 diabetes mellitus o Most recent lipid panel in chart from 04/09/2016 . Patient due for annual physical and lab work with PCP o Mr. Offield calls today to schedule this appointment o Agreeable to discussing restart of statin with provider at this appointment . Comprehensive medication review performed; medication list updated in electronic medical record . Reports he has been taking OTC naproxen 220mg  - 2 tablets each morning prn since he ran out of refills on his naproxen prescription. o Reports typically uses ~3-4x/week for back and muscle pain o Discuss using naproxen as needed and taking with food . Counsel on importance of blood sugar control o Reports taking:  - Ozempic 0.5 mg weekly (on Sundays) - Metformin ER 500 mg - 1 tablet daily with breakfast.  . Reports self-decreased his metformin at the beginning of this year when he started on  Ozempic, as he was having more stomach upset with the 2 tablets daily. - Denies recently checking blood sugar at home. Confirms having monitor . Denies currently checking blood pressure at home . Patient interested in obtaining flu vaccine at upcoming appointment  Patient Self Care Activities:  . Attends all scheduled provider appointments . Calls pharmacy for medication refills . Calls provider office for new concerns or questions   Initial goal documentation       Depression Screen PHQ 2/9 Scores 03/16/2019 11/23/2018 07/06/2018 04/01/2018  PHQ - 2 Score 0 0 0 0    Fall Risk Fall Risk  03/16/2019 11/23/2018 07/06/2018 04/01/2018 01/14/2018  Falls in the past year? 0 0 0 0 0  Number falls in past yr: 0 - - - -  Injury with Fall? 0 - - - -  Follow up - Falls evaluation completed Falls evaluation completed Falls evaluation completed Falls evaluation completed    [place]:  Any stairs in or around the home? Yes  If so, are there any without handrails? no  Home free of loose throw rugs in walkways, pet beds, electrical cords, etc? Yes  Adequate lighting in your home to reduce risk of falls? Yes   ASSISTIVE DEVICES UTILIZED TO PREVENT FALLS:  Life alert? No  Use of a cane, walker or w/c? No  Grab bars in the bathroom? No  Shower chair or bench in shower? No  Elevated toilet seat or a handicapped toilet? No    TIMED UP AND GO:  Unable to perform    Cognitive Function:        Screening Tests Health Maintenance  Topic Date Due  . OPHTHALMOLOGY EXAM  11/23/2018  . PNA vac Low Risk Adult (2 of 2 - PPSV23) 01/15/2019  . TETANUS/TDAP  11/24/2019 (Originally 07/26/1969)  . HEMOGLOBIN A1C  05/26/2019  . FOOT EXAM  11/23/2019  . Fecal DNA (Cologuard)  12/25/2021  . INFLUENZA VACCINE  Completed  . Hepatitis C Screening  Completed    Qualifies for Shingles Vaccine? Yes  Zostavax completed n/a. Due for Shingrix. Education has been provided  regarding the importance of this vaccine. Pt has been advised to call insurance company to determine out of pocket expense. Advised may also receive vaccine at local pharmacy or Health Dept. Verbalized acceptance and understanding.  Tdap:  Discussed need for TD/TDAP vaccine, patient verbalized understanding that this is not covered as a preventative with there insurance and to call the office if he develops any new skin injuries, ie: cuts, scrapes, bug bites, or open wounds.  Flu Vaccine:up to date   Pneumococcal Vaccine: due now- will receive next time in office.    Covid-19 Vaccine: on waitlist   Cancer Screenings:  Colorectal Screening: Completed cologuard 12/26/2018, waiting on colonoscopy.   Lung Cancer Screening: (Low Dose CT Chest recommended if Age 35-80 years, 30 pack-year currently smoking OR have quit w/in 15years.) does not qualify.    Additional Screening:  Hepatitis C Screening: does qualify; Completed 11/25/2018  Vision Screening: Recommended annual ophthalmology exams for early detection of glaucoma and other disorders of the eye. Is the patient up to date with their annual eye exam?  Yes  Who is the provider or what is the name of the office in which the pt attends annual eye exams? Dr.Nice   Dental Screening: Recommended annual dental exams for proper oral hygiene  Community Resource Referral:  CRR required this visit?  No        Plan:  I have personally reviewed and addressed the Medicare Annual Wellness questionnaire and have noted the following in the patient's chart:  A. Medical and social history B. Use of alcohol, tobacco or illicit drugs  C. Current medications and supplements D. Functional ability and status E.  Nutritional status F.  Physical activity G. Advance directives H. List of other physicians I.  Hospitalizations, surgeries, and ER visits in previous 12 months J.  [place] such as hearing and vision if needed, cognitive and  depression L. Referrals and appointments   In addition, I have reviewed and discussed with patient certain preventive protocols, quality metrics, and best practice recommendations. A written personalized care plan for preventive services as well as general preventive health recommendations were provided to patient.   Signed,    Bevelyn Ngo, LPN   [id number]  Nurse Health Advisor   Nurse Notes: none

## 2019-04-29 ENCOUNTER — Other Ambulatory Visit: Payer: Self-pay | Admitting: Family Medicine

## 2019-04-29 DIAGNOSIS — E1169 Type 2 diabetes mellitus with other specified complication: Secondary | ICD-10-CM

## 2019-06-03 ENCOUNTER — Other Ambulatory Visit: Payer: Self-pay | Admitting: Family Medicine

## 2019-06-03 ENCOUNTER — Telehealth: Payer: Self-pay | Admitting: *Deleted

## 2019-06-03 DIAGNOSIS — K219 Gastro-esophageal reflux disease without esophagitis: Secondary | ICD-10-CM

## 2019-06-03 NOTE — Telephone Encounter (Signed)
I called him regarding making an appt for his 6 month check up for refills.  "I don't have my calendar with me right now"..   "I'll call back this afternoon for the appt".  I let him know that was fine.   I also let him know I would give him a 30 day supply of his Prilosec so he would not run out.

## 2019-06-07 ENCOUNTER — Telehealth: Payer: Self-pay | Admitting: Family Medicine

## 2019-06-09 ENCOUNTER — Ambulatory Visit (INDEPENDENT_AMBULATORY_CARE_PROVIDER_SITE_OTHER): Payer: PPO | Admitting: Family Medicine

## 2019-06-09 ENCOUNTER — Other Ambulatory Visit: Payer: Self-pay

## 2019-06-09 ENCOUNTER — Encounter: Payer: Self-pay | Admitting: Family Medicine

## 2019-06-09 ENCOUNTER — Other Ambulatory Visit: Payer: Self-pay | Admitting: Family Medicine

## 2019-06-09 VITALS — BP 113/51 | HR 67 | Temp 97.1°F | Resp 16 | Ht 72.0 in | Wt 196.0 lb

## 2019-06-09 DIAGNOSIS — Z0000 Encounter for general adult medical examination without abnormal findings: Secondary | ICD-10-CM

## 2019-06-09 DIAGNOSIS — I10 Essential (primary) hypertension: Secondary | ICD-10-CM | POA: Diagnosis not present

## 2019-06-09 DIAGNOSIS — E1169 Type 2 diabetes mellitus with other specified complication: Secondary | ICD-10-CM | POA: Diagnosis not present

## 2019-06-09 DIAGNOSIS — E663 Overweight: Secondary | ICD-10-CM

## 2019-06-09 DIAGNOSIS — E785 Hyperlipidemia, unspecified: Secondary | ICD-10-CM

## 2019-06-09 DIAGNOSIS — R195 Other fecal abnormalities: Secondary | ICD-10-CM

## 2019-06-09 DIAGNOSIS — R351 Nocturia: Secondary | ICD-10-CM

## 2019-06-09 LAB — POCT GLYCOSYLATED HEMOGLOBIN (HGB A1C): Hemoglobin A1C: 6.4 % — AB (ref 4.0–5.6)

## 2019-06-09 MED ORDER — LOSARTAN POTASSIUM 100 MG PO TABS: 100.0000 mg | ORAL_TABLET | Freq: Every day | ORAL | 1 refills | Status: DC

## 2019-06-09 MED ORDER — AMLODIPINE BESYLATE 5 MG PO TABS: 5.0000 mg | ORAL_TABLET | Freq: Every day | ORAL | 1 refills | Status: DC

## 2019-06-09 NOTE — Patient Instructions (Addendum)
Thank you for coming to the office today.  Keep on Metformin 500mg  once daily  Keep on Ozempic 0.25mg  weekly  A1c will call with result.  DUE for FASTING BLOOD WORK (no food or drink after midnight before the lab appointment, only water or coffee without cream/sugar on the morning of)  SCHEDULE "Lab Only" visit in the morning at the clinic for lab draw in 6 MONTHS   - Make sure Lab Only appointment is at about 1 week before your next appointment, so that results will be available  For Lab Results, once available within 2-3 days of blood draw, you can can log in to MyChart online to view your results and a brief explanation. Also, we can discuss results at next follow-up visit.   Please schedule a Follow-up Appointment to: Return in about 6 months (around 12/10/2019) for Annual Physical.  If you have any other questions or concerns, please feel free to call the office or send a message through [place]. You may also schedule an earlier appointment if necessary.  Additionally, you may be receiving a survey about your experience at our office within a few days to 1 week by e-mail or mail. We value your feedback.  Nobie Putnam, DO [place]

## 2019-06-09 NOTE — Assessment & Plan Note (Signed)
Well-controlled HTN  No known complications     Plan:  1.  Continue current BP regimen Losartan 100mg daily Amlodipine 5mg daily 2. Encourage improved lifestyle - low sodium diet, regular exercise 3. Continue monitor BP outside office, bring readings to next visit, if persistently >140/90 or new symptoms notify office sooner 

## 2019-06-09 NOTE — Assessment & Plan Note (Signed)
Slight elevated A1c to 6.4, but overall controlled on GLP1 No hypoglycemia Complications - other including hyperlipidemia - increases risk of future cardiovascular complications   Plan:  1. CONTINUE Ozempic 0.25mg  weekly inj - Continue Metformin XR 500mg  x 1 daily still 2. Encourage improved lifestyle - low carb, low sugar diet, reduce portion size, start regular exercise. 3. Continue ARB, Statin  6 mo yearly

## 2019-06-09 NOTE — Progress Notes (Signed)
Subjective:    [name], male    DOB: 1950/10/21, 69 y.o.   MRN: [id number]  Jonathan Faulkner is a 69 y.o. male presenting on 06/09/2019 for Hypertension   HPI   CHRONIC DM, Type 2/ Overweight BMI >25 - Last visit with 11/2018, treated withcontinued ozempic with improvement on A1c and wt loss reduced appetite, see prior notes for background information. - Today doing well, due for A1c Has similar concern with some reduced appetite occasional nausea CBGs:No CBG detailed readings today Meds:Ozempic 0.25mg  weekly injection, Metformin XR 500mg  daily x 1 tab Reports good compliance. Tolerating well w/o side-effects Currently onARB Lifestyle: Weight up 5-7 lbs - Diet (balanced, improving, reduced portions)  Admits constipation - fruit juice to help, not using med Denies hypoglycemia, polyuria, visual changes, numbness or tingling.  CHRONIC HTN: Reports no new concern Current Meds - amlodipine 5mg  daily, losartan 100mg   - needs refills Reports good compliance, took meds today. Tolerating well, w/o complaints. Denies CP, dyspnea, HA, edema, dizziness / lightheadedness   Health Maintenance:  Due colonoscopy. Positive abnormal cologuard 12/2018, patient previously declined or deferred colonoscopy due to [place]. Now vaccinated, ready to proceed.  Depression screen [facility] 2/9 06/09/2019 03/16/2019 11/23/2018  Decreased Interest 0 0 0  Down, Depressed, Hopeless 0 0 0  PHQ - 2 Score 0 0 0    Social History   Tobacco Use  . Smoking status: Current Every Day Smoker    Years: 47.00    Types: Cigars  . Smokeless tobacco: Never Used  . Tobacco comment: 3-4 day  Substance Use Topics  . Alcohol use: No  . Drug use: No    Review of Systems Per HPI unless specifically indicated above     Objective:    BP (!) 113/51   Pulse 67   Temp (!) 97.1 F (36.2 C) (Temporal)   Resp 16   Ht 6' (1.829 m)   Wt 196 lb (88.9 kg)   SpO2 99%   BMI 26.58 kg/m   Wt  Readings from Last 3 Encounters:  06/09/19 196 lb (88.9 kg)  03/16/19 189 lb (85.7 kg)  11/23/18 191 lb (86.6 kg)    Physical Exam Vitals and nursing note reviewed.  Constitutional:      General: He is not in acute distress.    Appearance: He is well-developed. He is not diaphoretic.     Comments: Well-appearing, comfortable, cooperative  HENT:     Head: Normocephalic and atraumatic.  Eyes:     General:        Right eye: No discharge.        Left eye: No discharge.     Conjunctiva/sclera: Conjunctivae normal.  Cardiovascular:     Rate and Rhythm: Normal rate.  Pulmonary:     Effort: Pulmonary effort is normal.  Skin:    General: Skin is warm and dry.     Findings: No erythema or rash.  Neurological:     Mental Status: He is alert and oriented to person, place, and time.  Psychiatric:        Behavior: Behavior normal.     Comments: Well groomed, good eye contact, normal speech and thoughts      Recent Labs    07/16/18 0833 11/25/18 0800 06/09/19 1715  HGBA1C 5.8* 6.0* 6.4*     Results for orders placed or performed in visit on 06/09/19  POCT glycosylated hemoglobin (Hb A1C)  Result Value Ref Range   Hemoglobin A1C  6.4 (A) 4.0 - 5.6 %      Assessment & Plan:   Problem List Items Addressed This Visit    Type 2 diabetes mellitus with other specified complication ([place]) - Primary    Slight elevated A1c to 6.4, but overall controlled on GLP1 No hypoglycemia Complications - other including hyperlipidemia - increases risk of future cardiovascular complications   Plan:  1. CONTINUE Ozempic 0.25mg  weekly inj - Continue Metformin XR 500mg  x 1 daily still 2. Encourage improved lifestyle - low carb, low sugar diet, reduce portion size, start regular exercise. 3. Continue ARB, Statin  6 mo yearly      Relevant Medications   losartan (COZAAR) 100 MG tablet   metFORMIN (GLUCOPHAGE-XR) 500 MG 24 hr tablet   Other Relevant Orders   POCT glycosylated hemoglobin (Hb  A1C) (Completed)   HTN (hypertension)    Well-controlled HTN  No known complications    Plan:  1.  Continue current BP regimen Losartan 100mg  daily Amlodipine 5mg  daily 2. Encourage improved lifestyle - low sodium diet, regular exercise 3. Continue monitor BP outside office, bring readings to next visit, if persistently >140/90 or new symptoms notify office sooner      Relevant Medications   amLODipine (NORVASC) 5 MG tablet   losartan (COZAAR) 100 MG tablet    Other Visit Diagnoses    Positive colorectal cancer screening using Cologuard test       Relevant Orders   Ambulatory referral to Gastroenterology     Abnormal cologuard positive 12/2018, proceed now with GI Referral for colonoscopy diagnostic  Orders Placed This Encounter  Procedures  . Ambulatory referral to Gastroenterology    Referral Priority:   Routine    Referral Type:   Consultation    Referral Reason:   Specialty Services Required    Number of Visits Requested:   1  . POCT glycosylated hemoglobin (Hb A1C)      Meds ordered this encounter  Medications  . amLODipine (NORVASC) 5 MG tablet    Sig: Take 1 tablet (5 mg total) by mouth daily.    Dispense:  90 tablet    Refill:  1  . losartan (COZAAR) 100 MG tablet    Sig: Take 1 tablet (100 mg total) by mouth daily. TAKE 1 TABLET DAILY.    Dispense:  90 tablet    Refill:  1    Follow up plan: Return in about 6 months (around 12/10/2019) for Annual Physical.  Future labs ordered   Nobie Putnam, [facility] 06/09/2019, 4:46 PM

## 2019-06-14 ENCOUNTER — Telehealth (INDEPENDENT_AMBULATORY_CARE_PROVIDER_SITE_OTHER): Payer: Self-pay | Admitting: Gastroenterology

## 2019-06-14 ENCOUNTER — Other Ambulatory Visit: Payer: Self-pay

## 2019-06-14 DIAGNOSIS — R195 Other fecal abnormalities: Secondary | ICD-10-CM

## 2019-06-14 MED ORDER — NA SULFATE-K SULFATE-MG SULF 17.5-3.13-1.6 GM/177ML PO SOLN: 1.0000 | Freq: Once | ORAL | 0 refills | Status: AC

## 2019-06-14 NOTE — Progress Notes (Signed)
Gastroenterology Pre-Procedure Review  Request Date: Monday 06/28/19 Requesting Physician: Dr. Vicente Males  PATIENT REVIEW QUESTIONS: The patient responded to the following health history questions as indicated:    1. Are you having any GI issues? yes (Since starting Ozempic constipation, and change in taste.  Positive cologuard Test) 2. Do you have a personal history of Polyps? no 3. Do you have a family history of Colon Cancer or Polyps? no 4. Diabetes Mellitus? yes (type 2) 5. Joint replacements in the past 12 months?no 6. Major health problems in the past 3 months?no 7. Any artificial heart valves, MVP, or defibrillator?no    MEDICATIONS & ALLERGIES:    Patient reports the following regarding taking any anticoagulation/antiplatelet therapy:   Plavix, Coumadin, Eliquis, Xarelto, Lovenox, Pradaxa, Brilinta, or Effient? no Aspirin? no  Patient confirms/reports the following medications:  Current Outpatient Medications  Medication Sig Dispense Refill  . amLODipine (NORVASC) 5 MG tablet Take 1 tablet (5 mg total) by mouth daily. 90 tablet 1  . atorvastatin (LIPITOR) 10 MG tablet Take 1 tablet (10 mg total) by mouth at bedtime. 90 tablet 3  . losartan (COZAAR) 100 MG tablet Take 1 tablet (100 mg total) by mouth daily. TAKE 1 TABLET DAILY. 90 tablet 1  . metFORMIN (GLUCOPHAGE-XR) 500 MG 24 hr tablet Take 1 tablet (500 mg total) by mouth daily with breakfast. 90 tablet 3  . naproxen (NAPROSYN) 500 MG tablet TAKE (1) TABLET TWICE A DAY. (Patient taking differently: Take 440 mg by mouth every morning. TAKE (1) TABLET TWICE A DAY.) 60 tablet 2  . omeprazole (PRILOSEC) 20 MG capsule TAKE (1) CAPSULE TWICE DAILY BEFORE A MEAL. 60 capsule 0  . OZEMPIC, 0.25 OR 0.5 MG/DOSE, 2 MG/1.5ML SOPN Inject 0.25 mg into the skin once a week. 1.5 mL 3   No current facility-administered medications for this visit.    Patient confirms/reports the following allergies:  Allergies  Allergen Reactions  . Iodine  Hives    No orders of the defined types were placed in this encounter.   AUTHORIZATION INFORMATION Primary Insurance: 1D#: Group #:  Secondary Insurance: 1D#: Group #:  SCHEDULE INFORMATION: Date: 06/28/19 Time: Location:[facility]

## 2019-06-24 ENCOUNTER — Other Ambulatory Visit
Admission: RE | Admit: 2019-06-24 | Discharge: 2019-06-24 | Disposition: A | Discharge status: Home / Self Care | Payer: PPO | Source: Ambulatory Visit | Attending: Gastroenterology | Admitting: Gastroenterology

## 2019-06-24 DIAGNOSIS — Z01812 Encounter for preprocedural laboratory examination: Secondary | ICD-10-CM | POA: Insufficient documentation

## 2019-06-24 DIAGNOSIS — Z20822 Contact with and (suspected) exposure to covid-19: Secondary | ICD-10-CM | POA: Insufficient documentation

## 2019-06-24 LAB — SARS CORONAVIRUS 2 (TAT 6-24 HRS): SARS Coronavirus 2: NEGATIVE

## 2019-06-28 ENCOUNTER — Ambulatory Visit
Admission: RE | Admit: 2019-06-28 | Discharge: 2019-06-28 | Disposition: A | Discharge status: Home / Self Care | Payer: PPO | Attending: Gastroenterology | Admitting: Gastroenterology

## 2019-06-28 ENCOUNTER — Other Ambulatory Visit: Payer: Self-pay

## 2019-06-28 ENCOUNTER — Encounter: Payer: Self-pay | Admitting: Gastroenterology

## 2019-06-28 ENCOUNTER — Ambulatory Visit: Payer: PPO | Admitting: Anesthesiology

## 2019-06-28 ENCOUNTER — Encounter
Admission: RE | Disposition: A | Discharge status: Home / Self Care | Payer: Self-pay | Source: Home / Self Care | Attending: Gastroenterology

## 2019-06-28 DIAGNOSIS — E7800 Pure hypercholesterolemia, unspecified: Secondary | ICD-10-CM | POA: Insufficient documentation

## 2019-06-28 DIAGNOSIS — Z79899 Other long term (current) drug therapy: Secondary | ICD-10-CM | POA: Diagnosis not present

## 2019-06-28 DIAGNOSIS — K219 Gastro-esophageal reflux disease without esophagitis: Secondary | ICD-10-CM | POA: Insufficient documentation

## 2019-06-28 DIAGNOSIS — E119 Type 2 diabetes mellitus without complications: Secondary | ICD-10-CM | POA: Insufficient documentation

## 2019-06-28 DIAGNOSIS — D123 Benign neoplasm of transverse colon: Secondary | ICD-10-CM | POA: Insufficient documentation

## 2019-06-28 DIAGNOSIS — I10 Essential (primary) hypertension: Secondary | ICD-10-CM | POA: Insufficient documentation

## 2019-06-28 DIAGNOSIS — D127 Benign neoplasm of rectosigmoid junction: Secondary | ICD-10-CM | POA: Diagnosis not present

## 2019-06-28 DIAGNOSIS — Z7689 Persons encountering health services in other specified circumstances: Secondary | ICD-10-CM | POA: Diagnosis not present

## 2019-06-28 DIAGNOSIS — D124 Benign neoplasm of descending colon: Secondary | ICD-10-CM | POA: Insufficient documentation

## 2019-06-28 DIAGNOSIS — R195 Other fecal abnormalities: Secondary | ICD-10-CM

## 2019-06-28 DIAGNOSIS — K635 Polyp of colon: Secondary | ICD-10-CM

## 2019-06-28 DIAGNOSIS — F17290 Nicotine dependence, other tobacco product, uncomplicated: Secondary | ICD-10-CM | POA: Insufficient documentation

## 2019-06-28 DIAGNOSIS — D120 Benign neoplasm of cecum: Secondary | ICD-10-CM | POA: Diagnosis not present

## 2019-06-28 DIAGNOSIS — Z791 Long term (current) use of non-steroidal anti-inflammatories (NSAID): Secondary | ICD-10-CM | POA: Diagnosis not present

## 2019-06-28 DIAGNOSIS — Z7984 Long term (current) use of oral hypoglycemic drugs: Secondary | ICD-10-CM | POA: Insufficient documentation

## 2019-06-28 DIAGNOSIS — K5790 Diverticulosis of intestine, part unspecified, without perforation or abscess without bleeding: Secondary | ICD-10-CM | POA: Diagnosis not present

## 2019-06-28 HISTORY — DX: Constipation, unspecified: K59.00

## 2019-06-28 HISTORY — PX: COLONOSCOPY WITH PROPOFOL: SHX5780

## 2019-06-28 SURGERY — COLONOSCOPY WITH PROPOFOL
Anesthesia: General

## 2019-06-28 MED ORDER — PROPOFOL 10 MG/ML IV BOLUS
INTRAVENOUS | Status: DC | PRN
  Administered 2019-06-28: 80 mg via INTRAVENOUS

## 2019-06-28 MED ORDER — LIDOCAINE HCL (CARDIAC) PF 100 MG/5ML IV SOSY
PREFILLED_SYRINGE | INTRAVENOUS | Status: DC | PRN
  Administered 2019-06-28: 100 mg via INTRAVENOUS

## 2019-06-28 MED ORDER — SODIUM CHLORIDE 0.9 % IV SOLN
INTRAVENOUS | Status: DC
  Administered 2019-06-28: 1000 mL via INTRAVENOUS

## 2019-06-28 MED ORDER — PROPOFOL 500 MG/50ML IV EMUL
INTRAVENOUS | Status: DC | PRN
  Administered 2019-06-28: 140 ug/kg/min via INTRAVENOUS

## 2019-06-28 MED ORDER — GLYCOPYRROLATE 0.2 MG/ML IJ SOLN
INTRAMUSCULAR | Status: DC | PRN
  Administered 2019-06-28: .2 mg via INTRAVENOUS

## 2019-06-28 NOTE — Anesthesia Postprocedure Evaluation (Signed)
Anesthesia Post Note  Patient: NGOZI RECZEK  Procedure(s) Performed: COLONOSCOPY WITH PROPOFOL (N/A )  Patient location during evaluation: Endoscopy Anesthesia Type: General Level of consciousness: awake and alert and oriented Pain management: pain level controlled Vital Signs Assessment: post-procedure vital signs reviewed and stable Respiratory status: spontaneous breathing, nonlabored ventilation and respiratory function stable Cardiovascular status: blood pressure returned to baseline and stable Postop Assessment: no signs of nausea or vomiting Anesthetic complications: no     Last Vitals:  Vitals:   06/28/19 1027 06/28/19 1205  BP: 118/81 108/74  Pulse: 82 73  Resp: 18 18  Temp: 36.6 C (!) 36.1 C  SpO2: 99% (!) 88%    Last Pain:  Vitals:   06/28/19 1205  TempSrc: Tympanic  PainSc: 0-No pain                 Liddie Chichester

## 2019-06-28 NOTE — Op Note (Signed)
[facility] Gastroenterology Patient Name: Jonathan Faulkner Procedure Date: 06/28/2019 10:59 AM MRN: [id number] Account #: [id number] Date of Birth: July 11, 1950 Admit Type: Outpatient Age: 69 Room: [facility] ENDO ROOM 2 Gender: Male Note Status: Finalized Procedure:             Colonoscopy Indications:           Positive Cologuard test Providers:             Skie Vitrano B. Bonna Gains MD, MD Referring MD:          Olin Hauser (Referring MD) Medicines:             Monitored Anesthesia Care Complications:         No immediate complications. Procedure:             Pre-Anesthesia Assessment:                        - ASA Grade Assessment: II - A patient with mild                         systemic disease.                        - Prior to the procedure, a History and Physical was                         performed, and patient medications, allergies and                         sensitivities were reviewed. The patient's tolerance                         of previous anesthesia was reviewed.                        - The risks and benefits of the procedure and the                         sedation options and risks were discussed with the                         patient. All questions were answered and informed                         consent was obtained.                        - Patient identification and proposed procedure were                         verified prior to the procedure by the physician, the                         nurse, the anesthesiologist, the anesthetist and the                         technician. The procedure was verified in the                         procedure room.  After obtaining informed consent, the colonoscope was                         passed under direct vision. Throughout the procedure,                         the patient's blood pressure, pulse, and oxygen                         saturations were monitored  continuously. The                         Colonoscope was introduced through the anus and                         advanced to the the cecum, identified by appendiceal                         orifice and ileocecal valve. The colonoscopy was                         performed with ease. The patient tolerated the                         procedure well. The quality of the bowel preparation                         was good. Findings:      The perianal and digital rectal examinations were normal.      Seven flat polyps were found in the descending colon, transverse colon,       ascending colon and cecum. The polyps were 8 to 12 mm in size. These       polyps were removed with a hot snare. Resection and retrieval were       complete. One was in the cecum, 2 in the ascending colin, 2 in the       transverese colon and 2 in the left colon. The left colon ones were       placed in Jar 2. Others were in Jar 1      Four flat polyps were found in the recto-sigmoid colon, descending colon       and ascending colon. The polyps were 3 to 5 mm in size. These polyps       were removed with a cold snare. Resection and retrieval were complete.       One was in the ascending colon and placed in Jar 1. One was in the left       colon and placed in Jar 2. Two were in the rectosigmoid colon and placed       in Jar 3      The exam was otherwise without abnormality.      The rectum, sigmoid colon, descending colon, transverse colon, ascending       colon and cecum appeared normal.      The retroflexed view of the distal rectum and anal verge was normal and       showed no anal or rectal abnormalities. Impression:            - Seven 8 to 12 mm polyps in the descending colon, in  the transverse colon, in the ascending colon and in                         the cecum, removed with a hot snare. Resected and                         retrieved.                        - Four 3 to 5 mm polyps at the  recto-sigmoid colon, in                         the descending colon and in the ascending colon,                         removed with a cold snare. Resected and retrieved.                        - The examination was otherwise normal.                        - The rectum, sigmoid colon, descending colon,                         transverse colon, ascending colon and cecum are normal.                        - The distal rectum and anal verge are normal on                         retroflexion view. Recommendation:        - Discharge patient to home (with escort).                        - Advance diet as tolerated.                        - Continue present medications.                        - Await pathology results.                        - Repeat colonoscopy date to be determined after                         pending pathology results are reviewed.                        - The findings and recommendations were discussed with                         the patient.                        - The findings and recommendations were discussed with                         the patient's family.                        -  Return to primary care physician as previously                         scheduled. Procedure Code(s):     --- Professional ---                        [phone], Colonoscopy, flexible; with removal of                         tumor(s), polyp(s), or other lesion(s) by snare                         technique Diagnosis Code(s):     --- Professional ---                        K63.5, Polyp of colon                        R19.5, Other fecal abnormalities CPT copyright 2019 American Medical Association. All rights reserved. The codes documented in this report are preliminary and upon coder review may  be revised to meet current compliance requirements.  Vonda Antigua, MD Margretta Sidle B. Bonna Gains MD, MD 06/28/2019 12:07:32 PM This report has been signed electronically. Number of Addenda: 0 Note  Initiated On: 06/28/2019 10:59 AM Scope Withdrawal Time: 0 hours 42 minutes 54 seconds  Total Procedure Duration: 0 hours 48 minutes 41 seconds  Estimated Blood Loss:  Estimated blood loss: none.      [facility]

## 2019-06-28 NOTE — Transfer of Care (Signed)
Immediate Anesthesia Transfer of Care Note  Patient: Jonathan Faulkner  Procedure(s) Performed: COLONOSCOPY WITH PROPOFOL (N/A )  Patient Location: PACU  Anesthesia Type:General  Level of Consciousness: sedated  Airway & Oxygen Therapy: Patient Spontanous Breathing and Patient connected to nasal cannula oxygen  Post-op Assessment: Report given to RN and Post -op Vital signs reviewed and stable  Post vital signs: Reviewed and stable  Last Vitals:  Vitals Value Taken Time  BP 162/78 06/28/19 1216  Temp 36.1 C 06/28/19 1205  Pulse 117 06/28/19 1220  Resp 19 06/28/19 1220  SpO2 92 % 06/28/19 1220  Vitals shown include unvalidated device data.  Last Pain:  Vitals:   06/28/19 1205  TempSrc: Tympanic  PainSc: 0-No pain         Complications: No apparent anesthesia complications

## 2019-06-28 NOTE — H&P (Signed)
Vonda Antigua, MD [address] [place], [place], [place], [address] [address], [place], [place], [place], [address] Phone: [phone]  Fax: [phone]  Primary Care Physician:  Olin Hauser, DO   Pre-Procedure History & Physical: HPI:  Jonathan Faulkner is a 68 y.o. male is here for a colonoscopy.   Past Medical History:  Diagnosis Date  . Constipation   . GERD (gastroesophageal reflux disease)   . Hypercholesterolemia   . Hypertension   . Measles    hx  . Varicella without mention of complication    hx    Past Surgical History:  Procedure Laterality Date  . COLONOSCOPY WITH PROPOFOL    . None      Prior to Admission medications   Medication Sig Start Date End Date Taking? Authorizing Provider  metFORMIN (GLUCOPHAGE-XR) 500 MG 24 hr tablet Take 1 tablet (500 mg total) by mouth daily with breakfast. 06/09/19  Yes Karamalegos, Devonne Doughty, DO  omeprazole (PRILOSEC) 20 MG capsule TAKE (1) CAPSULE TWICE DAILY BEFORE A MEAL. 06/03/19  Yes Karamalegos, Alexander J, DO  amLODipine (NORVASC) 5 MG tablet Take 1 tablet (5 mg total) by mouth daily. 06/09/19   Karamalegos, Devonne Doughty, DO  atorvastatin (LIPITOR) 10 MG tablet Take 1 tablet (10 mg total) by mouth at bedtime. 11/27/18   Karamalegos, Devonne Doughty, DO  losartan (COZAAR) 100 MG tablet Take 1 tablet (100 mg total) by mouth daily. TAKE 1 TABLET DAILY. 06/09/19   Karamalegos, Devonne Doughty, DO  naproxen (NAPROSYN) 500 MG tablet TAKE (1) TABLET TWICE A DAY. Patient taking differently: Take 440 mg by mouth every morning. TAKE (1) TABLET TWICE A DAY. 01/27/18   Karamalegos, Alexander J, DO  OZEMPIC, 0.25 OR 0.5 MG/DOSE, 2 MG/1.5ML SOPN Inject 0.25 mg into the skin once a week. 04/29/19   Olin Hauser, DO    Allergies as of 06/15/2019 - Review Complete 06/14/2019  Allergen Reaction Noted  . Iodine Hives 04/05/2011    Family History  Adopted: Yes  Family history unknown: Yes    Social History     Socioeconomic History  . Marital status: Married    Spouse name: Not on file  . Number of children: Not on file  . Years of education: Not on file  . Highest education level: Not on file  Occupational History  . Occupation: retired   Tobacco Use  . Smoking status: Current Every Day Smoker    Years: 47.00    Types: Cigars  . Smokeless tobacco: Never Used  . Tobacco comment: 3-4 day  Substance and Sexual Activity  . Alcohol use: No  . Drug use: No  . Sexual activity: Yes  Other Topics Concern  . Not on file  Social History Narrative  . Not on file   Social Determinants of Health   Financial Resource Strain:   . Difficulty of Paying Living Expenses:   Food Insecurity:   . Worried About Charity fundraiser in the Last Year:   . Arboriculturist in the Last Year:   Transportation Needs:   . Film/video editor (Medical):   Marland Kitchen Lack of Transportation (Non-Medical):   Physical Activity:   . Days of Exercise per Week:   . Minutes of Exercise per Session:   Stress:   . Feeling of Stress :   Social Connections:   . Frequency of Communication with Friends and Family:   . Frequency of Social Gatherings with Friends and Family:   .  Attends Religious Services:   . Active Member of Clubs or Organizations:   . Attends Archivist Meetings:   Marland Kitchen Marital Status:   Intimate Partner Violence:   . Fear of Current or Ex-Partner:   . Emotionally Abused:   Marland Kitchen Physically Abused:   . Sexually Abused:     Review of Systems: See HPI, otherwise negative ROS  Physical Exam: BP 118/81   Pulse 82   Temp 97.8 F (36.6 C) (Tympanic)   Resp 18   Ht 6' (1.829 m)   Wt 89.4 kg   SpO2 99%   BMI 26.72 kg/m  General:   Alert,  pleasant and cooperative in NAD Head:  Normocephalic and atraumatic. Neck:  Supple; no masses or thyromegaly. Lungs:  Clear throughout to auscultation, normal respiratory effort.    Heart:  +S1, +S2, Regular rate and rhythm, No edema. Abdomen:  Soft,  nontender and nondistended. Normal bowel sounds, without guarding, and without rebound.   Neurologic:  Alert and  oriented x4;  grossly normal neurologically.  Impression/Plan: Jonathan Faulkner is here for a colonoscopy to be performed for positive cologuard.  Risks, benefits, limitations, and alternatives regarding  colonoscopy have been reviewed with the patient.  Questions have been answered.  All parties agreeable.   Virgel Manifold, MD  06/28/2019, 10:51 AM

## 2019-06-28 NOTE — Anesthesia Preprocedure Evaluation (Signed)
Anesthesia Evaluation  Patient identified by MRN, date of birth, ID band Patient awake    Reviewed: Allergy & Precautions, NPO status , Patient's Chart, lab work & pertinent test results  History of Anesthesia Complications Negative for: history of anesthetic complications  Airway Mallampati: III  TM Distance: >3 FB Neck ROM: Full    Dental  (+) Poor Dentition   Pulmonary neg sleep apnea, neg COPD, Current Smoker and Patient abstained from smoking.,    breath sounds clear to auscultation- rhonchi (-) wheezing      Cardiovascular hypertension, Pt. on medications (-) CAD, (-) Past MI, (-) Cardiac Stents and (-) CABG  Rhythm:Regular Rate:Normal - Systolic murmurs and - Diastolic murmurs    Neuro/Psych neg Seizures negative neurological ROS  negative psych ROS   GI/Hepatic Neg liver ROS, GERD  ,  Endo/Other  diabetes, Oral Hypoglycemic Agents  Renal/GU negative Renal ROS     Musculoskeletal negative musculoskeletal ROS (+)   Abdominal (+) - obese,   Peds  Hematology negative hematology ROS (+)   Anesthesia Other Findings Past Medical History: No date: Constipation No date: GERD (gastroesophageal reflux disease) No date: Hypercholesterolemia No date: Hypertension No date: Measles     Comment:  hx No date: Varicella without mention of complication     Comment:  hx   Reproductive/Obstetrics                             Anesthesia Physical Anesthesia Plan  ASA: II  Anesthesia Plan: General   Post-op Pain Management:    Induction: Intravenous  PONV Risk Score and Plan: 1 and Propofol infusion  Airway Management Planned: Natural Airway  Additional Equipment:   Intra-op Plan:   Post-operative Plan:   Informed Consent: I have reviewed the patients History and Physical, chart, labs and discussed the procedure including the risks, benefits and alternatives for the proposed  anesthesia with the patient or authorized representative who has indicated his/her understanding and acceptance.     Dental advisory given  Plan Discussed with: CRNA and Anesthesiologist  Anesthesia Plan Comments:         Anesthesia Quick Evaluation

## 2019-06-29 ENCOUNTER — Encounter: Payer: Self-pay | Admitting: *Deleted

## 2019-06-29 ENCOUNTER — Encounter: Payer: Self-pay | Admitting: Gastroenterology

## 2019-06-29 LAB — SURGICAL PATHOLOGY

## 2019-06-30 ENCOUNTER — Other Ambulatory Visit: Payer: Self-pay | Admitting: Family Medicine

## 2019-06-30 DIAGNOSIS — K219 Gastro-esophageal reflux disease without esophagitis: Secondary | ICD-10-CM

## 2019-09-06 ENCOUNTER — Other Ambulatory Visit: Payer: Self-pay | Admitting: Family Medicine

## 2019-09-06 DIAGNOSIS — E1169 Type 2 diabetes mellitus with other specified complication: Secondary | ICD-10-CM

## 2019-09-24 ENCOUNTER — Other Ambulatory Visit: Payer: Self-pay | Admitting: Family Medicine

## 2019-09-24 DIAGNOSIS — E1169 Type 2 diabetes mellitus with other specified complication: Secondary | ICD-10-CM

## 2019-09-24 NOTE — Telephone Encounter (Signed)
Please review Sig for this medication- last visit Rx states 1 daily- sent to office for review

## 2019-09-27 NOTE — Telephone Encounter (Signed)
Sig should be once daily. Unless patient notifies [place] otherwise. Changed rx   Nobie Putnam, [facility] 09/27/2019, 6:14 PM

## 2019-10-28 ENCOUNTER — Other Ambulatory Visit: Payer: Self-pay | Admitting: Family Medicine

## 2019-10-28 DIAGNOSIS — E1169 Type 2 diabetes mellitus with other specified complication: Secondary | ICD-10-CM

## 2019-10-28 NOTE — Telephone Encounter (Signed)
Requested Prescriptions  Pending Prescriptions Disp Refills  . OZEMPIC, 0.25 OR 0.5 MG/DOSE, 2 MG/1.5ML SOPN [Pharmacy Med Name: OZEMPIC 0.25-0.5 MG DOSE PEN] 1.5 mL 0    Sig: Inject 0.25 mg into the skin once a week.     Endocrinology:  Diabetes - GLP-1 Receptor Agonists Passed - 10/28/2019  8:54 AM      Passed - HBA1C is between 0 and 7.9 and within 180 days    Hemoglobin A1C  Date Value Ref Range Status  06/09/2019 6.4 (A) 4.0 - 5.6 % Final   Hgb A1c MFr Bld  Date Value Ref Range Status  11/25/2018 6.0 (H) <5.7 % of total Hgb Final    Comment:    For someone without known diabetes, a hemoglobin  A1c value between 5.7% and 6.4% is consistent with prediabetes and should be confirmed with a  follow-up test. . For someone with known diabetes, a value <7% indicates that their diabetes is well controlled. A1c targets should be individualized based on duration of diabetes, age, comorbid conditions, and other considerations. . This assay result is consistent with an increased risk of diabetes. . Currently, no consensus exists regarding use of hemoglobin A1c for diagnosis of diabetes for children. Renella Cunas - Valid encounter within last 6 months    Recent Outpatient Visits          4 months ago Type 2 diabetes mellitus with other specified complication, without long-term current use of insulin [facility])   [facility] Olin Hauser, DO   11 months ago Annual physical exam   [facility] Olin Hauser, DO   1 year ago Type 2 diabetes mellitus with other specified complication, without long-term current use of insulin [facility])   [facility], Devonne Doughty, DO   1 year ago Type 2 diabetes mellitus with other specified complication, without long-term current use of insulin [facility])   [facility] Olin Hauser, DO   1 year ago Type 2 diabetes mellitus with other specified  complication, without long-term current use of insulin [facility])   [place], Devonne Doughty, DO

## 2019-11-19 ENCOUNTER — Ambulatory Visit: Payer: PPO | Admitting: Family Medicine

## 2019-11-29 ENCOUNTER — Other Ambulatory Visit: Payer: Self-pay | Admitting: Family Medicine

## 2019-11-29 DIAGNOSIS — E1169 Type 2 diabetes mellitus with other specified complication: Secondary | ICD-10-CM

## 2019-11-29 NOTE — Telephone Encounter (Signed)
Requested medication (s) are due for refill today: yes  Requested medication (s) are on the active medication list: yes  Last refill:  11/27/18  Future visit scheduled: no  Notes to clinic:  overdue lab work   Requested Prescriptions  Pending Prescriptions Disp Refills   atorvastatin (LIPITOR) 10 MG tablet [Pharmacy Med Name: ATORVASTATIN 10 MG TABLET] 90 tablet 0    Sig: Take 1 tablet (10 mg total) by mouth at bedtime.      Cardiovascular:  Antilipid - Statins Failed - 11/29/2019 12:19 PM      Failed - Total Cholesterol in normal range and within 360 days    Cholesterol, Total  Date Value Ref Range Status  03/16/2015 132 100 - 199 mg/dL Final   Cholesterol  Date Value Ref Range Status  11/25/2018 140 <200 mg/dL Final          Failed - LDL in normal range and within 360 days    LDL Cholesterol (Calc)  Date Value Ref Range Status  11/25/2018 93 mg/dL (calc) Final    Comment:    Reference range: <100 . Desirable range <100 mg/dL for primary prevention;   <70 mg/dL for patients with CHD or diabetic patients  with > or = 2 CHD risk factors. Marland Kitchen LDL-C is now calculated using the Martin-Hopkins  calculation, which is a validated novel method providing  better accuracy than the Friedewald equation in the  estimation of LDL-C.  Cresenciano Genre et al. Annamaria Helling. 1601;093(23): 2061-2068  (http://education.[employer].com/faq/FAQ164)           Failed - HDL in normal range and within 360 days    HDL  Date Value Ref Range Status  11/25/2018 29 (L) > OR = 40 mg/dL Final  03/16/2015 30 (L) >39 mg/dL Final          Failed - Triglycerides in normal range and within 360 days    Triglycerides  Date Value Ref Range Status  11/25/2018 86 <150 mg/dL Final          Passed - Patient is not pregnant      Passed - Valid encounter within last 12 months    Recent Outpatient Visits           5 months ago Type 2 diabetes mellitus with other specified complication, without long-term  current use of insulin ([place])   [facility] [place], Devonne Doughty, DO   1 year ago Annual physical exam   [facility] Olin Hauser, DO   1 year ago Type 2 diabetes mellitus with other specified complication, without long-term current use of insulin [facility])   [facility] [place], Devonne Doughty, DO   1 year ago Type 2 diabetes mellitus with other specified complication, without long-term current use of insulin [facility])   [facility] [place] [place], Devonne Doughty, DO   1 year ago Type 2 diabetes mellitus with other specified complication, without long-term current use of insulin [facility])   [place], Devonne Doughty, DO

## 2019-12-11 ENCOUNTER — Other Ambulatory Visit: Payer: Self-pay | Admitting: Family Medicine

## 2019-12-11 DIAGNOSIS — E1169 Type 2 diabetes mellitus with other specified complication: Secondary | ICD-10-CM

## 2019-12-11 NOTE — Telephone Encounter (Signed)
Requested Prescriptions  Pending Prescriptions Disp Refills   OZEMPIC, 0.25 OR 0.5 MG/DOSE, 2 MG/1.5ML SOPN [Pharmacy Med Name: OZEMPIC 0.25-0.5 MG DOSE PEN] 1.5 mL 0    Sig: Inject 0.25 mg into the skin once a week.     Endocrinology:  Diabetes - GLP-1 Receptor Agonists Failed - 12/11/2019  9:32 AM      Failed - HBA1C is between 0 and 7.9 and within 180 days    Hemoglobin A1C  Date Value Ref Range Status  06/09/2019 6.4 (A) 4.0 - 5.6 % Final   Hgb A1c MFr Bld  Date Value Ref Range Status  11/25/2018 6.0 (H) <5.7 % of total Hgb Final    Comment:    For someone without known diabetes, a hemoglobin  A1c value between 5.7% and 6.4% is consistent with prediabetes and should be confirmed with a  follow-up test. . For someone with known diabetes, a value <7% indicates that their diabetes is well controlled. A1c targets should be individualized based on duration of diabetes, age, comorbid conditions, and other considerations. . This assay result is consistent with an increased risk of diabetes. . Currently, no consensus exists regarding use of hemoglobin A1c for diagnosis of diabetes for children. .          Failed - Valid encounter within last 6 months    Recent Outpatient Visits          6 months ago Type 2 diabetes mellitus with other specified complication, without long-term current use of insulin ([place])   [place], DO   1 year ago Annual physical exam   [facility] Olin Hauser, DO   1 year ago Type 2 diabetes mellitus with other specified complication, without long-term current use of insulin [facility])   [facility], Devonne Doughty, DO   1 year ago Type 2 diabetes mellitus with other specified complication, without long-term current use of insulin [facility])   [facility] Olin Hauser, DO   1 year ago Type 2 diabetes mellitus with other specified  complication, without long-term current use of insulin ([place])   [place], Devonne Doughty, DO      Future Appointments            In 5 days [place], Devonne Doughty, DO [facility]

## 2019-12-16 ENCOUNTER — Other Ambulatory Visit: Payer: Self-pay

## 2019-12-16 ENCOUNTER — Encounter: Payer: Self-pay | Admitting: Family Medicine

## 2019-12-16 ENCOUNTER — Other Ambulatory Visit: Payer: Self-pay | Admitting: Family Medicine

## 2019-12-16 ENCOUNTER — Ambulatory Visit (INDEPENDENT_AMBULATORY_CARE_PROVIDER_SITE_OTHER): Payer: PPO | Admitting: Family Medicine

## 2019-12-16 VITALS — BP 118/55 | HR 59 | Temp 97.1°F | Resp 16 | Ht 72.0 in | Wt 199.6 lb

## 2019-12-16 DIAGNOSIS — Z9189 Other specified personal risk factors, not elsewhere classified: Secondary | ICD-10-CM

## 2019-12-16 DIAGNOSIS — M216X1 Other acquired deformities of right foot: Secondary | ICD-10-CM | POA: Diagnosis not present

## 2019-12-16 DIAGNOSIS — I8393 Asymptomatic varicose veins of bilateral lower extremities: Secondary | ICD-10-CM | POA: Insufficient documentation

## 2019-12-16 DIAGNOSIS — E1169 Type 2 diabetes mellitus with other specified complication: Secondary | ICD-10-CM

## 2019-12-16 DIAGNOSIS — Z0000 Encounter for general adult medical examination without abnormal findings: Secondary | ICD-10-CM

## 2019-12-16 DIAGNOSIS — M216X2 Other acquired deformities of left foot: Secondary | ICD-10-CM

## 2019-12-16 DIAGNOSIS — R351 Nocturia: Secondary | ICD-10-CM

## 2019-12-16 DIAGNOSIS — Z23 Encounter for immunization: Secondary | ICD-10-CM | POA: Diagnosis not present

## 2019-12-16 DIAGNOSIS — I10 Essential (primary) hypertension: Secondary | ICD-10-CM | POA: Diagnosis not present

## 2019-12-16 LAB — POCT GLYCOSYLATED HEMOGLOBIN (HGB A1C): Hemoglobin A1C: 6.7 % — AB (ref 4.0–5.6)

## 2019-12-16 MED ORDER — AMLODIPINE BESYLATE 5 MG PO TABS: 5.0000 mg | ORAL_TABLET | Freq: Every day | ORAL | 1 refills | Status: DC

## 2019-12-16 MED ORDER — ATORVASTATIN CALCIUM 10 MG PO TABS: 10.0000 mg | ORAL_TABLET | Freq: Every day | ORAL | 3 refills | Status: AC

## 2019-12-16 MED ORDER — OZEMPIC (0.25 OR 0.5 MG/DOSE) 2 MG/1.5ML ~~LOC~~ SOPN: 0.2500 mg | PEN_INJECTOR | SUBCUTANEOUS | 1 refills | Status: AC

## 2019-12-16 MED ORDER — LOSARTAN POTASSIUM 100 MG PO TABS: 100.0000 mg | ORAL_TABLET | Freq: Every day | ORAL | 1 refills | Status: DC

## 2019-12-16 NOTE — Patient Instructions (Addendum)
Thank you for coming to the office today.  Consider return to Podiatry to discuss foot architecture for pain.  Consider Vascular specialist for vein / circulation in toes / feet  No sign of neuropathy  Refilled all meds including Ozempic.  Recent Labs    06/09/19 1715 12/16/19 1209  HGBA1C 6.4* 6.7*    Flu shot  DUE for FASTING BLOOD WORK (no food or drink after midnight before the lab appointment, only water or coffee without cream/sugar on the morning of)  SCHEDULE "Lab Only" visit in the morning at the clinic for lab draw in 1-2 months when available  - Make sure Lab Only appointment is at about 1 week before your next appointment, so that results will be available  For Lab Results, once available within 2-3 days of blood draw, you can can log in to MyChart online to view your results and a brief explanation. Also, we can discuss results at next follow-up visit.   Please schedule a Follow-up Appointment to: Return in about 2 months (around 02/15/2020) for 1-2 months fasting lab only then 1 week later Annual Physical.  If you have any other questions or concerns, please feel free to call the office or send a message through [place]. You may also schedule an earlier appointment if necessary.  Additionally, you may be receiving a survey about your experience at our office within a few days to 1 week by e-mail or mail. We value your feedback.  Nobie Putnam, DO [place]

## 2019-12-16 NOTE — Assessment & Plan Note (Signed)
Slight elevated A1c to 6.7, but overall controlled on GLP1 No hypoglycemia Complications - other including hyperlipidemia - increases risk of future cardiovascular complications   Plan:  1. CONTINUE Ozempic 0.25mg  weekly inj - tolerated dose, re order submitted today 90 day supply - Continue Metformin XR 500mg  x 1 daily still 2. Encourage improved lifestyle - low carb, low sugar diet, reduce portion size, start regular exercise. 3. Continue ARB, Statin

## 2019-12-16 NOTE — Progress Notes (Signed)
Subjective:    [name], male    DOB: 06-Oct-1950, 69 y.o.   MRN: [id number]  Jonathan Faulkner is a 70 y.o. male presenting on 12/16/2019 for Diabetes   HPI    CHRONIC DM, Type 2/ Overweight BMI >25 Previous visit treated withcontinued ozempic with improvement on A1c and wt loss reduced appetite, see prior notes for background information. - Today doing well, due for A1c Has similar concern with some reduced appetite occasional nausea CBGs:No CBG detailed readings today Meds:Ozempic 0.25mg  weekly injection, Metformin XR 500mg  daily x 1 tab Reports good compliance. Tolerating well w/o side-effects Currently onARB Lifestyle - Diet (balanced, improving, reduced portions) ' Feet hurting, feet and toes cold, uses compression Long history in past working on concrete floor Denies hypoglycemia, polyuria, visual changes, numbness or tingling.   CHRONIC HTN: Reportsno new concern Current Meds -amlodipine 5mg  daily, losartan 100mg - needs refills Reports good compliance, took meds today. Tolerating well, w/o complaints. Denies CP, dyspnea, HA, edema, dizziness / lightheadedness  Foot pain, Pes Cavus / Varicose veins Chronic problem, see above mention He has some R>L toe pain worse with standing, has good footwear, seen podiatry in past.   Health Maintenance: Asking about covid booster, will check antibody in future  Depression screen [facility] 2/9 12/16/2019 06/09/2019 03/16/2019  Decreased Interest 0 0 0  Down, Depressed, Hopeless 0 0 0  PHQ - 2 Score 0 0 0    Social History   Tobacco Use  . Smoking status: Current Every Day Smoker    Years: 47.00    Types: Cigars  . Smokeless tobacco: Never Used  . Tobacco comment: 3-4 day  Vaping Use  . Vaping Use: Never used  Substance Use Topics  . Alcohol use: No  . Drug use: No    Review of Systems Per HPI unless specifically indicated above     Objective:    BP (!) 118/55   Pulse (!) 59   Temp (!)  97.1 F (36.2 C) (Temporal)   Resp 16   Ht 6' (1.829 m)   Wt 199 lb 9.6 oz (90.5 kg)   SpO2 99%   BMI 27.07 kg/m   Wt Readings from Last 3 Encounters:  12/16/19 199 lb 9.6 oz (90.5 kg)  06/28/19 197 lb (89.4 kg)  06/09/19 196 lb (88.9 kg)    Physical Exam Vitals and nursing note reviewed.  Constitutional:      General: He is not in acute distress.    Appearance: He is well-developed. He is not diaphoretic.     Comments: Well-appearing, comfortable, cooperative  HENT:     Head: Normocephalic and atraumatic.  Eyes:     General:        Right eye: No discharge.        Left eye: No discharge.     Conjunctiva/sclera: Conjunctivae normal.  Cardiovascular:     Rate and Rhythm: Normal rate.  Pulmonary:     Effort: Pulmonary effort is normal.  Skin:    General: Skin is warm and dry.     Findings: No erythema or rash.  Neurological:     Mental Status: He is alert and oriented to person, place, and time.  Psychiatric:        Behavior: Behavior normal.     Comments: Well groomed, good eye contact, normal speech and thoughts      Diabetic Foot Exam - Simple   Simple Foot Form Diabetic Foot exam was performed with the  following findings: Yes 12/16/2019 12:11 PM  Visual Inspection See comments: Yes Sensation Testing Intact to touch and monofilament testing bilaterally: Yes Pulse Check Posterior Tibialis and Dorsalis pulse intact bilaterally: Yes Comments High arches pes cavus, callus formation heels and great toe, no ulceration. Hammer toe R foot     Results for orders placed or performed in visit on 12/16/19  POCT HgB A1C  Result Value Ref Range   Hemoglobin A1C 6.7 (A) 4.0 - 5.6 %   Recent Labs    06/09/19 1715 12/16/19 1209  HGBA1C 6.4* 6.7*       Assessment & Plan:   Problem List Items Addressed This Visit    Type 2 diabetes mellitus with other specified complication ([place]) - Primary    Slight elevated A1c to 6.7, but overall controlled on GLP1 No  hypoglycemia Complications - other including hyperlipidemia - increases risk of future cardiovascular complications   Plan:  1. CONTINUE Ozempic 0.25mg  weekly inj - tolerated dose, re order submitted today 90 day supply - Continue Metformin XR 500mg  x 1 daily still 2. Encourage improved lifestyle - low carb, low sugar diet, reduce portion size, start regular exercise. 3. Continue ARB, Statin      Relevant Medications   OZEMPIC, 0.25 OR 0.5 MG/DOSE, 2 MG/1.5ML SOPN   atorvastatin (LIPITOR) 10 MG tablet   losartan (COZAAR) 100 MG tablet   Other Relevant Orders   POCT HgB A1C (Completed)   Essential hypertension    Well-controlled HTN  No known complications     Plan:  1.  Continue current BP regimen Losartan 100mg  daily Amlodipine 5mg  daily 2. Encourage improved lifestyle - low sodium diet, regular exercise 3. Continue monitor BP outside office, bring readings to next visit, if persistently >140/90 or new symptoms notify office sooner      Relevant Medications   amLODipine (NORVASC) 5 MG tablet   atorvastatin (LIPITOR) 10 MG tablet   losartan (COZAAR) 100 MG tablet   Asymptomatic varicose veins of both lower extremities   Relevant Medications   amLODipine (NORVASC) 5 MG tablet   atorvastatin (LIPITOR) 10 MG tablet   losartan (COZAAR) 100 MG tablet   Acquired bilateral pes cavus    Other Visit Diagnoses    Needs flu shot       Relevant Orders   Flu Vaccine QUAD High Dose(Fluad) (Completed)        #Foot pain Varicose vs spider veins bilateral lower ext Pes Cavus bilateral Chronic gradual problem with worsening lower extremity foot pain mostly at toes, not endorsing neuropathy or numbness tingling, has sensation Foot exam today shows pes cavus with abnormal architecture, had seen podiatry in past, he has good shows for support and insoles, however still has abnormal anatomy of toes R>L likely causing increased pressure and possible pain - varicose spider veins R>L may  contribute to colder sensation of toes, no evidence of arterial insufficiency - but may consider vascular referral in future for further diagnostics    Meds ordered this encounter  Medications  . OZEMPIC, 0.25 OR 0.5 MG/DOSE, 2 MG/1.5ML SOPN    Sig: Inject 0.25 mg into the skin once a week.    Dispense:  4.5 mL    Refill:  1    84 day supply / 3 month  . amLODipine (NORVASC) 5 MG tablet    Sig: Take 1 tablet (5 mg total) by mouth daily.    Dispense:  90 tablet    Refill:  1  . atorvastatin (LIPITOR)  10 MG tablet    Sig: Take 1 tablet (10 mg total) by mouth at bedtime.    Dispense:  90 tablet    Refill:  3  . losartan (COZAAR) 100 MG tablet    Sig: Take 1 tablet (100 mg total) by mouth daily. TAKE 1 TABLET DAILY.    Dispense:  90 tablet    Refill:  1    Follow up plan: Return in about 2 months (around 02/15/2020) for 1-2 months fasting lab only then 1 week later Annual Physical.  Future labs ordered for physical and add COVID antibody test with other physical labs. Skip A1c since done today. 12/16/19  Nobie Putnam, [facility] 12/16/2019, 11:58 AM

## 2019-12-16 NOTE — Assessment & Plan Note (Signed)
Well-controlled HTN  No known complications     Plan:  1.  Continue current BP regimen Losartan 100mg  daily Amlodipine 5mg  daily 2. Encourage improved lifestyle - low sodium diet, regular exercise 3. Continue monitor BP outside office, bring readings to next visit, if persistently >140/90 or new symptoms notify office sooner

## 2019-12-22 ENCOUNTER — Other Ambulatory Visit: Payer: Self-pay | Admitting: *Deleted

## 2019-12-22 DIAGNOSIS — E1169 Type 2 diabetes mellitus with other specified complication: Secondary | ICD-10-CM

## 2019-12-22 DIAGNOSIS — Z9189 Other specified personal risk factors, not elsewhere classified: Secondary | ICD-10-CM

## 2019-12-22 DIAGNOSIS — Z0000 Encounter for general adult medical examination without abnormal findings: Secondary | ICD-10-CM

## 2019-12-22 DIAGNOSIS — I10 Essential (primary) hypertension: Secondary | ICD-10-CM

## 2019-12-22 DIAGNOSIS — E785 Hyperlipidemia, unspecified: Secondary | ICD-10-CM

## 2019-12-22 DIAGNOSIS — R351 Nocturia: Secondary | ICD-10-CM

## 2019-12-23 ENCOUNTER — Other Ambulatory Visit: Payer: PPO

## 2019-12-23 ENCOUNTER — Other Ambulatory Visit: Payer: Self-pay

## 2019-12-23 DIAGNOSIS — R351 Nocturia: Secondary | ICD-10-CM | POA: Diagnosis not present

## 2019-12-23 DIAGNOSIS — Z9189 Other specified personal risk factors, not elsewhere classified: Secondary | ICD-10-CM | POA: Diagnosis not present

## 2019-12-23 DIAGNOSIS — E785 Hyperlipidemia, unspecified: Secondary | ICD-10-CM | POA: Diagnosis not present

## 2019-12-23 DIAGNOSIS — I10 Essential (primary) hypertension: Secondary | ICD-10-CM | POA: Diagnosis not present

## 2019-12-23 DIAGNOSIS — E1169 Type 2 diabetes mellitus with other specified complication: Secondary | ICD-10-CM | POA: Diagnosis not present

## 2019-12-23 DIAGNOSIS — Z0000 Encounter for general adult medical examination without abnormal findings: Secondary | ICD-10-CM | POA: Diagnosis not present

## 2019-12-28 LAB — CBC WITH DIFFERENTIAL/PLATELET
Absolute Monocytes: 567 cells/uL (ref 200–950)
Basophils Absolute: 63 cells/uL (ref 0–200)
Basophils Relative: 1 %
Eosinophils Absolute: 239 cells/uL (ref 15–500)
Eosinophils Relative: 3.8 %
HCT: 50 % (ref 38.5–50.0)
Hemoglobin: 17.5 g/dL — ABNORMAL HIGH (ref 13.2–17.1)
Lymphs Abs: 1871 cells/uL (ref 850–3900)
MCH: 33 pg (ref 27.0–33.0)
MCHC: 35 g/dL (ref 32.0–36.0)
MCV: 94.3 fL (ref 80.0–100.0)
MPV: 10.1 fL (ref 7.5–12.5)
Monocytes Relative: 9 %
Neutro Abs: 3560 cells/uL (ref 1500–7800)
Neutrophils Relative %: 56.5 %
Platelets: 246 10*3/uL (ref 140–400)
RBC: 5.3 10*6/uL (ref 4.20–5.80)
RDW: 13.4 % (ref 11.0–15.0)
Total Lymphocyte: 29.7 %
WBC: 6.3 10*3/uL (ref 3.8–10.8)

## 2019-12-28 LAB — COMPLETE METABOLIC PANEL WITH GFR
AG Ratio: 1.9 (calc) (ref 1.0–2.5)
ALT: 27 U/L (ref 9–46)
AST: 19 U/L (ref 10–35)
Albumin: 4.6 g/dL (ref 3.6–5.1)
Alkaline phosphatase (APISO): 70 U/L (ref 35–144)
BUN: 13 mg/dL (ref 7–25)
CO2: 24 mmol/L (ref 20–32)
Calcium: 10.2 mg/dL (ref 8.6–10.3)
Chloride: 103 mmol/L (ref 98–110)
Creat: 0.81 mg/dL (ref 0.70–1.25)
GFR, Est African American: 107 mL/min/{1.73_m2} (ref >=60)
GFR, Est Non African American: 92 mL/min/{1.73_m2} (ref >=60)
Globulin: 2.4 g/dL (calc) (ref 1.9–3.7)
Glucose, Bld: 137 mg/dL — ABNORMAL HIGH (ref 65–99)
Potassium: 4.5 mmol/L (ref 3.5–5.3)
Sodium: 139 mmol/L (ref 135–146)
Total Bilirubin: 0.8 mg/dL (ref 0.2–1.2)
Total Protein: 7 g/dL (ref 6.1–8.1)

## 2019-12-28 LAB — TSH: TSH: 1.59 mIU/L (ref 0.40–4.50)

## 2019-12-28 LAB — LIPID PANEL
Cholesterol: 135 mg/dL (ref <200)
HDL: 34 mg/dL — ABNORMAL LOW (ref >=40)
LDL Cholesterol (Calc): 85 mg/dL (calc)
Non-HDL Cholesterol (Calc): 101 mg/dL (calc) (ref <130)
Total CHOL/HDL Ratio: 4 (calc) (ref <5.0)
Triglycerides: 74 mg/dL (ref <150)

## 2019-12-28 LAB — PSA: PSA: 0.8 ng/mL (ref <=4.0)

## 2019-12-28 LAB — SARS-COV-2 SEMI-QUANTITATIVE TOTAL ANTIBODY, SPIKE: SARS COV2 AB, Total Spike Semi QN: 162.6 U/mL — ABNORMAL HIGH (ref <0.8)

## 2020-04-11 ENCOUNTER — Ambulatory Visit: Payer: PPO

## 2020-04-11 ENCOUNTER — Telehealth: Payer: Self-pay

## 2020-04-11 NOTE — Telephone Encounter (Signed)
This nurse called patient in order to perform scheduled telephonic AWV. Patient answered and said that he was tied up at work. Informed him that we will call him to reschedule for another time.

## 2020-05-23 ENCOUNTER — Ambulatory Visit (INDEPENDENT_AMBULATORY_CARE_PROVIDER_SITE_OTHER): Payer: PPO

## 2020-05-23 VITALS — Ht 69.0 in | Wt 194.0 lb

## 2020-05-23 DIAGNOSIS — Z0000 Encounter for general adult medical examination without abnormal findings: Secondary | ICD-10-CM | POA: Diagnosis not present

## 2020-05-23 NOTE — Patient Instructions (Signed)
Mr. Jonathan Faulkner , Thank you for taking time to come for your Medicare Wellness Visit. I appreciate your ongoing commitment to your health goals. Please review the following plan we discussed and let me know if I can assist you in the future.   Screening recommendations/referrals: Colonoscopy: completed 06/28/2019, due 06/27/2021 Recommended yearly ophthalmology/optometry visit for glaucoma screening and checkup Recommended yearly dental visit for hygiene and checkup  Vaccinations: Influenza vaccine: completed 12/16/2019, due 09/04/2020 Pneumococcal vaccine: due Tdap vaccine: due Shingles vaccine: discussed   Covid-19:  01/19/2020, 05/21/2019, 04/30/2019  Advanced directives: Please bring a copy of your POA (Power of Attorney) and/or Living Will to your next appointment.   Conditions/risks identified: smoking  Next appointment: Follow up in one year for your annual wellness visit.   Preventive Care 68 Years and Older, Male Preventive care refers to lifestyle choices and visits with your health care provider that can promote health and wellness. What does preventive care include?  A yearly physical exam. This is also called an annual well check.  Dental exams once or twice a year.  Routine eye exams. Ask your health care provider how often you should have your eyes checked.  Personal lifestyle choices, including:  Daily care of your teeth and gums.  Regular physical activity.  Eating a healthy diet.  Avoiding tobacco and drug use.  Limiting alcohol use.  Practicing safe sex.  Taking low doses of aspirin every day.  Taking vitamin and mineral supplements as recommended by your health care provider. What happens during an annual well check? The services and screenings done by your health care provider during your annual well check will depend on your age, overall health, lifestyle risk factors, and family history of disease. Counseling  Your health care provider may ask you  questions about your:  Alcohol use.  Tobacco use.  Drug use.  Emotional well-being.  Home and relationship well-being.  Sexual activity.  Eating habits.  History of falls.  Memory and ability to understand (cognition).  Work and work Statistician. Screening  You may have the following tests or measurements:  Height, weight, and BMI.  Blood pressure.  Lipid and cholesterol levels. These may be checked every 5 years, or more frequently if you are over 50 years old.  Skin check.  Lung cancer screening. You may have this screening every year starting at age 68 if you have a 30-pack-year history of smoking and currently smoke or have quit within the past 15 years.  Fecal occult blood test (FOBT) of the stool. You may have this test every year starting at age 68.  Flexible sigmoidoscopy or colonoscopy. You may have a sigmoidoscopy every 5 years or a colonoscopy every 10 years starting at age 68.  Prostate cancer screening. Recommendations will vary depending on your family history and other risks.  Hepatitis C blood test.  Hepatitis B blood test.  Sexually transmitted disease (STD) testing.  Diabetes screening. This is done by checking your blood sugar (glucose) after you have not eaten for a while (fasting). You may have this done every 1-3 years.  Abdominal aortic aneurysm (AAA) screening. You may need this if you are a current or former smoker.  Osteoporosis. You may be screened starting at age 22 if you are at high risk. Talk with your health care provider about your test results, treatment options, and if necessary, the need for more tests. Vaccines  Your health care provider may recommend certain vaccines, such as:  Influenza vaccine. This is recommended  every year. 68  Tetanus, diphtheria, and acellular pertussis (Tdap, Td) vaccine. You may need a Td booster every 10 years.  Zoster vaccine. You may need this after age 39.  Pneumococcal 13-valent conjugate  (PCV13) vaccine. One dose is recommended after age 16.  Pneumococcal polysaccharide (PPSV23) vaccine. One dose is recommended after age 70. Talk to your health care provider about which screenings and vaccines you need and how often you need them. This information is not intended to replace advice given to you by your health care provider. Make sure you discuss any questions you have with your health care provider. Document Released: 02/17/2015 Document Revised: 10/11/2015 Document Reviewed: 11/22/2014 Elsevier Interactive Patient Education  2017 [place] Prevention in the Home Falls can cause injuries. They can happen to people of all ages. There are many things you can do to make your home safe and to help prevent falls. What can I do on the outside of my home?  Regularly fix the edges of walkways and driveways and fix any cracks.  Remove anything that might make you trip as you walk through a door, such as a raised step or threshold.  Trim any bushes or trees on the path to your home.  Use bright outdoor lighting.  Clear any walking paths of anything that might make someone trip, such as rocks or tools.  Regularly check to see if handrails are loose or broken. Make sure that both sides of any steps have handrails.  Any raised decks and porches should have guardrails on the edges.  Have any leaves, snow, or ice cleared regularly.  Use sand or salt on walking paths during winter.  Clean up any spills in your garage right away. This includes oil or grease spills. What can I do in the bathroom?  Use night lights.  Install grab bars by the toilet and in the tub and shower. Do not use towel bars as grab bars.  Use non-skid mats or decals in the tub or shower.  If you need to sit down in the shower, use a plastic, non-slip stool.  Keep the floor dry. Clean up any water that spills on the floor as soon as it happens.  Remove soap buildup in the tub or shower  regularly.  Attach bath mats securely with double-sided non-slip rug tape.  Do not have throw rugs and other things on the floor that can make you trip. What can I do in the bedroom?  Use night lights.  Make sure that you have a light by your bed that is easy to reach.  Do not use any sheets or blankets that are too big for your bed. They should not hang down onto the floor.  Have a firm chair that has side arms. You can use this for support while you get dressed.  Do not have throw rugs and other things on the floor that can make you trip. What can I do in the kitchen?  Clean up any spills right away.  Avoid walking on wet floors.  Keep items that you use a lot in easy-to-reach places.  If you need to reach something above you, use a strong step stool that has a grab bar.  Keep electrical cords out of the way.  Do not use floor polish or wax that makes floors slippery. If you must use wax, use non-skid floor wax.  Do not have throw rugs and other things on the floor that can make you trip. What  can I do with my stairs?  Do not leave any items on the stairs.  Make sure that there are handrails on both sides of the stairs and use them. Fix handrails that are broken or loose. Make sure that handrails are as long as the stairways.  Check any carpeting to make sure that it is firmly attached to the stairs. Fix any carpet that is loose or worn.  Avoid having throw rugs at the top or bottom of the stairs. If you do have throw rugs, attach them to the floor with carpet tape.  Make sure that you have a light switch at the top of the stairs and the bottom of the stairs. If you do not have them, ask someone to add them for you. What else can I do to help prevent falls?  Wear shoes that:  Do not have high heels.  Have rubber bottoms.  Are comfortable and fit you well.  Are closed at the toe. Do not wear sandals.  If you use a stepladder:  Make sure that it is fully  opened. Do not climb a closed stepladder.  Make sure that both sides of the stepladder are locked into place.  Ask someone to hold it for you, if possible.  Clearly mark and make sure that you can see:  Any grab bars or handrails.  First and last steps.  Where the edge of each step is.  Use tools that help you move around (mobility aids) if they are needed. These include:  Canes.  Walkers.  Scooters.  Crutches.  Turn on the lights when you go into a dark area. Replace any light bulbs as soon as they burn out.  Set up your furniture so you have a clear path. Avoid moving your furniture around.  If any of your floors are uneven, fix them.  If there are any pets around you, be aware of where they are.  Review your medicines with your doctor. Some medicines can make you feel dizzy. This can increase your chance of falling. Ask your doctor what other things that you can do to help prevent falls. This information is not intended to replace advice given to you by your health care provider. Make sure you discuss any questions you have with your health care provider. Document Released: 11/17/2008 Document Revised: 06/29/2015 Document Reviewed: 02/25/2014 Elsevier Interactive Patient Education  2017 [employer].

## 2020-05-23 NOTE — Progress Notes (Signed)
I connected with Jonathan Faulkner today by telephone and verified that I am speaking with the correct person using two identifiers. Location patient: home Location provider: work Persons participating in the virtual visit: Scott, Vanderveer LPN.   I discussed the limitations, risks, security and privacy concerns of performing an evaluation and management service by telephone and the availability of in person appointments. I also discussed with the patient that there may be a patient responsible charge related to this service. The patient expressed understanding and verbally consented to this telephonic visit.    Interactive audio and video telecommunications were attempted between this provider and patient, however failed, due to patient having technical difficulties OR patient did not have access to video capability.  We continued and completed visit with audio only.     Vital signs may be patient reported or missing.  Subjective:   Jonathan Faulkner is a 70 y.o. male who presents for Medicare Annual/Subsequent preventive examination.  Review of Systems     Cardiac Risk Factors include: advanced age (>56men, >67 women);diabetes mellitus;dyslipidemia;hypertension;male gender;smoking/ tobacco exposure     Objective:    Today's Vitals   05/23/20 1016  Weight: 194 lb (88 kg)  Height: 5\' 9"  (1.753 m)   Body mass index is 28.65 kg/m.  Advanced Directives 05/23/2020 06/28/2019 03/16/2019 12/02/2014  Does Patient Have a Medical Advance Directive? Yes No Yes No;Yes  Type of Paramedic of Council;Living will - Living will;Healthcare Power of Attorney Living will  Copy of [place] in Chart? No - copy requested - No - copy requested No - copy requested    Current Medications (verified) Outpatient Encounter Medications as of 05/23/2020  Medication Sig  . amLODipine (NORVASC) 5 MG tablet Take 1 tablet (5 mg total) by mouth daily.  Marland Kitchen  atorvastatin (LIPITOR) 10 MG tablet Take 1 tablet (10 mg total) by mouth at bedtime.  Marland Kitchen losartan (COZAAR) 100 MG tablet Take 1 tablet (100 mg total) by mouth daily. TAKE 1 TABLET DAILY.  . metFORMIN (GLUCOPHAGE-XR) 500 MG 24 hr tablet Take 1 tablet (500 mg total) by mouth daily with breakfast.  . naproxen (NAPROSYN) 500 MG tablet TAKE (1) TABLET TWICE A DAY. (Patient taking differently: Take 440 mg by mouth every morning. TAKE (1) TABLET TWICE A DAY.)  . omeprazole (PRILOSEC) 20 MG capsule TAKE (1) CAPSULE TWICE DAILY BEFORE A MEAL.  Marland Kitchen OZEMPIC, 0.25 OR 0.5 MG/DOSE, 2 MG/1.5ML SOPN Inject 0.25 mg into the skin once a week.   No facility-administered encounter medications on file as of 05/23/2020.    Allergies (verified) Iodine   History: Past Medical History:  Diagnosis Date  . Constipation   . GERD (gastroesophageal reflux disease)   . Hypercholesterolemia   . Hypertension   . Measles    hx  . Varicella without mention of complication    hx   Past Surgical History:  Procedure Laterality Date  . COLONOSCOPY WITH PROPOFOL    . COLONOSCOPY WITH PROPOFOL N/A 06/28/2019   Procedure: COLONOSCOPY WITH PROPOFOL;  Surgeon: Virgel Manifold, MD;  Location: [facility] ENDOSCOPY;  Service: Endoscopy;  Laterality: N/A;  . None     Family History  Adopted: Yes  Family history unknown: Yes   Social History   Socioeconomic History  . Marital status: Married    Spouse name: Not on file  . Number of children: Not on file  . Years of education: Not on file  . Highest education level: Not  on file  Occupational History  . Occupation: retired   Tobacco Use  . Smoking status: Current Every Day Smoker    Years: 47.00    Types: Cigars  . Smokeless tobacco: Never Used  . Tobacco comment: 3-4 day  Vaping Use  . Vaping Use: Never used  Substance and Sexual Activity  . Alcohol use: No  . Drug use: No  . Sexual activity: Yes  Other Topics Concern  . Not on file  Social History Narrative  .  Not on file   Social Determinants of Health   Financial Resource Strain: Low Risk   . Difficulty of Paying Living Expenses: Not hard at all  Food Insecurity: No Food Insecurity  . Worried About Charity fundraiser in the Last Year: Never true  . Ran Out of Food in the Last Year: Never true  Transportation Needs: No Transportation Needs  . Lack of Transportation (Medical): No  . Lack of Transportation (Non-Medical): No  Physical Activity: Inactive  . Days of Exercise per Week: 0 days  . Minutes of Exercise per Session: 0 min  Stress: No Stress Concern Present  . Feeling of Stress : Not at all  Social Connections: Not on file    Tobacco Counseling Ready to quit: Not Answered Counseling given: Not Answered Comment: 3-4 day   Clinical Intake:  Pre-visit preparation completed: Yes  Pain : No/denies pain     Nutritional Status: BMI 25 -29 Overweight Nutritional Risks: None Diabetes: Yes  How often do you need to have someone help you when you read instructions, pamphlets, or other written materials from your doctor or pharmacy?: 1 - Never What is the last grade level you completed in school?: 12th grade  Diabetic?yes Nutrition Risk Assessment:  Has the patient had any N/V/D within the last 2 months?  No  Does the patient have any non-healing wounds?  No  Has the patient had any unintentional weight loss or weight gain?  No   Diabetes:  Is the patient diabetic?  Yes  If diabetic, was a CBG obtained today?  No  Did the patient bring in their glucometer from home?  No  How often do you monitor your CBG's? Once week.   Financial Strains and Diabetes Management:  Are you having any financial strains with the device, your supplies or your medication? No .  Does the patient want to be seen by Chronic Care Management for management of their diabetes?  No  Would the patient like to be referred to a Nutritionist or for Diabetic Management?  No   Diabetic  Exams:  Diabetic Eye Exam: Overdue for diabetic eye exam. Pt has been advised about the importance in completing this exam. Patient advised to call and schedule an eye exam. Diabetic Foot Exam: Completed 12/16/2019   Interpreter Needed?: No  Information entered by :: NAllen LPN   Activities of Daily Living In your present state of health, do you have any difficulty performing the following activities: 05/23/2020  Hearing? N  Vision? N  Difficulty concentrating or making decisions? N  Walking or climbing stairs? N  Dressing or bathing? N  Doing errands, shopping? N  Preparing Food and eating ? N  Using the Toilet? N  In the past six months, have you accidently leaked urine? N  Do you have problems with loss of bowel control? N  Managing your Medications? N  Managing your Finances? N  Housekeeping or managing your Housekeeping? N  Some recent data  might be hidden    Patient Care Team: Olin Hauser, DO as PCP - General (Family Medicine) Dhalla, Virl Diamond, RPH-CPP as Pharmacist  Indicate any recent Medical Services you may have received from other than [facility] providers in the past year (date may be approximate).     Assessment:   This is a routine wellness examination for Jonathan Faulkner.  Hearing/Vision screen No exam data present  Dietary issues and exercise activities discussed: Current Exercise Habits: The patient has a physically strenuous job, but has no regular exercise apart from work.  Goals    . Patient Stated     05/23/2020, watch what he eats    . PharmD - Medication Review     Current Barriers:  . Patient with type 2 diabetes mellitus not currently on statin for reduction of ASCVD risk  Pharmacist Clinical Goal(s):  Marland Kitchen Over the next 30 days, patient will work with CM Pharmacist and PCP to address barriers to statin therapy for Mr. Barley  Interventions: . Assess adherence to statin (as identified by quality team/health plan).  o Per chart, patient  previously on atorvastatin, but stopped in Nov 2018 without clear reason or side effect. Has remained on medication list, but not renewed or refilled. o Counsel patient on benefit of taking statin medication for ASCVD risk reduction, particularly given patient with type 2 diabetes mellitus o Most recent lipid panel in chart from 04/09/2016 . Patient due for annual physical and lab work with PCP o Mr. Pettis calls today to schedule this appointment o Agreeable to discussing restart of statin with provider at this appointment . Comprehensive medication review performed; medication list updated in electronic medical record . Reports he has been taking OTC naproxen 220mg  - 2 tablets each morning prn since he ran out of refills on his naproxen prescription. o Reports typically uses ~3-4x/week for back and muscle pain o Discuss using naproxen as needed and taking with food . Counsel on importance of blood sugar control o Reports taking:  - Ozempic 0.5 mg weekly (on Sundays) - Metformin ER 500 mg - 1 tablet daily with breakfast.  . Reports self-decreased his metformin at the beginning of this year when he started on Ozempic, as he was having more stomach upset with the 2 tablets daily. - Denies recently checking blood sugar at home. Confirms having monitor . Denies currently checking blood pressure at home . Patient interested in obtaining flu vaccine at upcoming appointment  Patient Self Care Activities:  . Attends all scheduled provider appointments . Calls pharmacy for medication refills . Calls provider office for new concerns or questions   Initial goal documentation       Depression Screen PHQ 2/9 Scores 05/23/2020 12/16/2019 06/09/2019 03/16/2019 11/23/2018 07/06/2018 04/01/2018  PHQ - 2 Score 0 0 0 0 0 0 0    Fall Risk Fall Risk  05/23/2020 12/16/2019 06/09/2019 03/16/2019 11/23/2018  Falls in the past year? 0 0 0 0 0  Number falls in past yr: - 0 0 0 -  Injury with Fall? - 0 0 0 -  Risk  for fall due to : Medication side effect - - - -  Follow up Falls evaluation completed;Education provided;Falls prevention discussed Falls evaluation completed Falls evaluation completed - Falls evaluation completed    FALL RISK PREVENTION PERTAINING TO THE HOME:  Any stairs in or around the home? Yes  If so, are there any without handrails? No  Home free of loose throw rugs in walkways, pet beds,  electrical cords, etc? Yes  Adequate lighting in your home to reduce risk of falls? Yes   ASSISTIVE DEVICES UTILIZED TO PREVENT FALLS:  Life alert? No  Use of a cane, walker or w/c? No  Grab bars in the bathroom? No  Shower chair or bench in shower? No  Elevated toilet seat or a handicapped toilet? Yes   TIMED UP AND GO:  Was the test performed? No . .  Cognitive Function:     6CIT Screen 05/23/2020  What Year? 0 points  What month? 0 points  What time? 0 points  Count back from 20 0 points  Months in reverse 0 points  Repeat phrase 2 points  Total Score 2    Immunizations Immunization History  Administered Date(s) Administered  . Fluad Quad(high Dose 65+) 11/23/2018, 12/16/2019  . Influenza, High Dose Seasonal PF 12/16/2017  . Influenza,inj,Quad PF,6+ Mos 12/02/2014, 01/02/2016, 12/10/2016  . PFIZER(Purple Top)SARS-COV-2 Vaccination 04/30/2019, 05/21/2019  . Pneumococcal Conjugate-13 01/14/2018    TDAP status: Due, Education has been provided regarding the importance of this vaccine. Advised may receive this vaccine at local pharmacy or Health Dept. Aware to provide a copy of the vaccination record if obtained from local pharmacy or Health Dept. Verbalized acceptance and understanding.  Flu Vaccine status: Up to date  Pneumococcal vaccine status: Due, Education has been provided regarding the importance of this vaccine. Advised may receive this vaccine at local pharmacy or Health Dept. Aware to provide a copy of the vaccination record if obtained from local pharmacy or  Health Dept. Verbalized acceptance and understanding.  Covid-19 vaccine status: Completed vaccines  Qualifies for Shingles Vaccine? Yes   Zostavax completed No   Shingrix Completed?: No.    Education has been provided regarding the importance of this vaccine. Patient has been advised to call insurance company to determine out of pocket expense if they have not yet received this vaccine. Advised may also receive vaccine at local pharmacy or Health Dept. Verbalized acceptance and understanding.  Screening Tests Health Maintenance  Topic Date Due  . TETANUS/TDAP  Never done  . OPHTHALMOLOGY EXAM  11/23/2018  . PNA vac Low Risk Adult (2 of 2 - PPSV23) 01/15/2019  . COVID-19 Vaccine (3 - Booster for Pfizer series) 11/20/2019  . HEMOGLOBIN A1C  06/14/2020  . INFLUENZA VACCINE  09/04/2020  . FOOT EXAM  12/15/2020  . COLONOSCOPY (Pts 45-28yrs Insurance coverage will need to be confirmed)  06/27/2021  . Hepatitis C Screening  Completed  . HPV VACCINES  Aged Out    Health Maintenance  Health Maintenance Due  Topic Date Due  . TETANUS/TDAP  Never done  . OPHTHALMOLOGY EXAM  11/23/2018  . PNA vac Low Risk Adult (2 of 2 - PPSV23) 01/15/2019  . COVID-19 Vaccine (3 - Booster for Pfizer series) 11/20/2019    Colorectal cancer screening: Type of screening: Colonoscopy. Completed 06/28/2019. Repeat every 2 years  Lung Cancer Screening: (Low Dose CT Chest recommended if Age 50-80 years, 30 pack-year currently smoking OR have quit w/in 15years.) does not qualify.   Lung Cancer Screening Referral: no  Additional Screening:  Hepatitis C Screening: does qualify; Completed 11/25/2018  Vision Screening: Recommended annual ophthalmology exams for early detection of glaucoma and other disorders of the eye. Is the patient up to date with their annual eye exam?  No  Who is the provider or what is the name of the office in which the patient attends annual eye exams? Dr. Matilde Sprang If pt is not  established  with a provider, would they like to be referred to a provider to establish care? No .   Dental Screening: Recommended annual dental exams for proper oral hygiene  Community Resource Referral / Chronic Care Management: CRR required this visit?  No   CCM required this visit?  No      Plan:     I have personally reviewed and noted the following in the patient's chart:   . Medical and social history . Use of alcohol, tobacco or illicit drugs  . Current medications and supplements . Functional ability and status . Nutritional status . Physical activity . Advanced directives . List of other physicians . Hospitalizations, surgeries, and ER visits in previous 12 months . Vitals . Screenings to include cognitive, depression, and falls . Referrals and appointments  In addition, I have reviewed and discussed with patient certain preventive protocols, quality metrics, and best practice recommendations. A written personalized care plan for preventive services as well as general preventive health recommendations were provided to patient.     Kellie Simmering, LPN   [id number]/[id number]   Nurse Notes:

## 2020-05-25 ENCOUNTER — Other Ambulatory Visit: Payer: Self-pay | Admitting: Family Medicine

## 2020-05-25 DIAGNOSIS — E1169 Type 2 diabetes mellitus with other specified complication: Secondary | ICD-10-CM

## 2020-06-09 ENCOUNTER — Other Ambulatory Visit: Payer: Self-pay | Admitting: Family Medicine

## 2020-06-09 DIAGNOSIS — I10 Essential (primary) hypertension: Secondary | ICD-10-CM

## 2020-06-14 ENCOUNTER — Other Ambulatory Visit: Payer: Self-pay | Admitting: Family Medicine

## 2020-06-14 DIAGNOSIS — I10 Essential (primary) hypertension: Secondary | ICD-10-CM

## 2020-07-31 ENCOUNTER — Other Ambulatory Visit: Payer: Self-pay | Admitting: Family Medicine

## 2020-07-31 DIAGNOSIS — K219 Gastro-esophageal reflux disease without esophagitis: Secondary | ICD-10-CM

## 2020-08-04 ENCOUNTER — Ambulatory Visit: Payer: Self-pay | Admitting: *Deleted

## 2020-08-04 ENCOUNTER — Telehealth: Payer: Self-pay

## 2020-09-04 NOTE — Telephone Encounter (Signed)
Received a call from [employer] with the [facility] office. He called and says the patient has passed away due to a cardiac arrest. He says EMS was called after the patient came inside from working outside and went unresponsive. He says CPR was administered for 30 minutes before it was stopped due to no response. He says he has to call and report it to the PCP so that the death certificate could be signed. I advised the office is closed for lunch at this time, but I will send this to the office for the provider to be aware. He says he will call the medical examiner. Officer Georgiann Cocker says he can be reached at [phone], if Dr. Parks Ranger wants to call.

## 2020-09-04 NOTE — Telephone Encounter (Addendum)
Pt called in c/o chest pain per the agent.   The agent mentioned he had been working in the yard and his chest was hurting and he thinks he pulled a muscle in his arm.   When he got connected to me I heard shuffling for about 15 seconds.   I repeatedly called his name over and over trying to get his attention.   It got quiet and I heard snoring respirations.   I hung up and called back 3 more times without a response.  The number was [phone].  I called his wife Shawnmichael Parenteau as his emergency contact from his chart and she answered on [phone].  She answered and when I told her who I was and calling from [facility] because Sanjeev just called [place] c/o chest pain she replied,   "Oh I was trying to get y'all".    I stopped her and immediately let her know I think he may be unresponsive because I could hear heavy snoring respirations when his call was connected to me.   I let her know the answering machine message was from [place].  She replied,   "Oh he is at home".    I instructed her to call 911 and did she know the address to request the ambulance to go to and she replied,  "Yes and I'll call right now".    The call ended at this point". Reason for Disposition  Passed out (i.e., lost consciousness, collapsed and was not responding)    I could hear snoring respirations but he never spoke to me.   I heard a lot of shuffling then the snoring respirations.  Protocols used: Chest Pain-A-AH

## 2020-09-04 NOTE — Telephone Encounter (Signed)
See telephone note for today regarding patient passing.  Requested to sign death certificate  Nobie Putnam, DO [employer] 08-10-2020, 2:27 PM

## 2020-09-04 DEATH — deceased

## 2020-09-11 IMAGING — DX DG CHEST 2V
2 series · 2 of 2 positions shown · non-contrast
Comparison: None.

CLINICAL DATA: Preop right shoulder surgery, smoker.

EXAM:
CHEST - 2 VIEW

[chest pa]
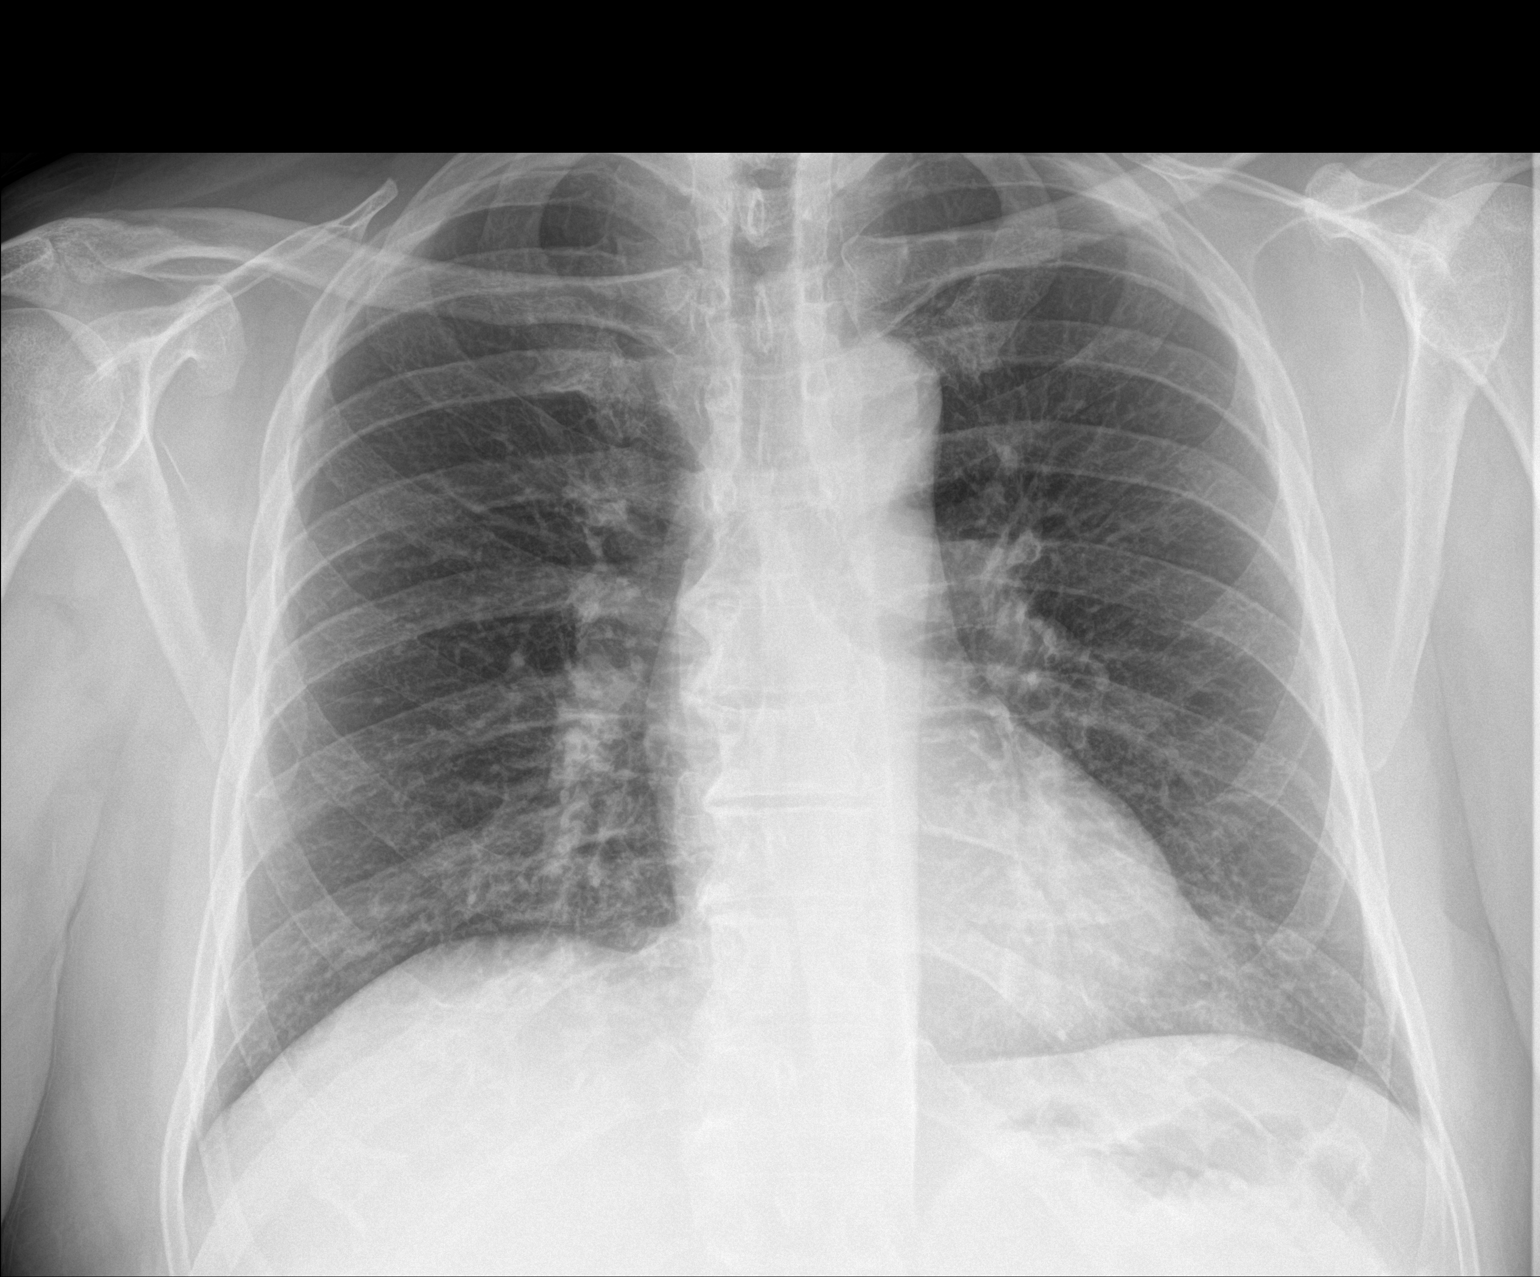

[chest lat]
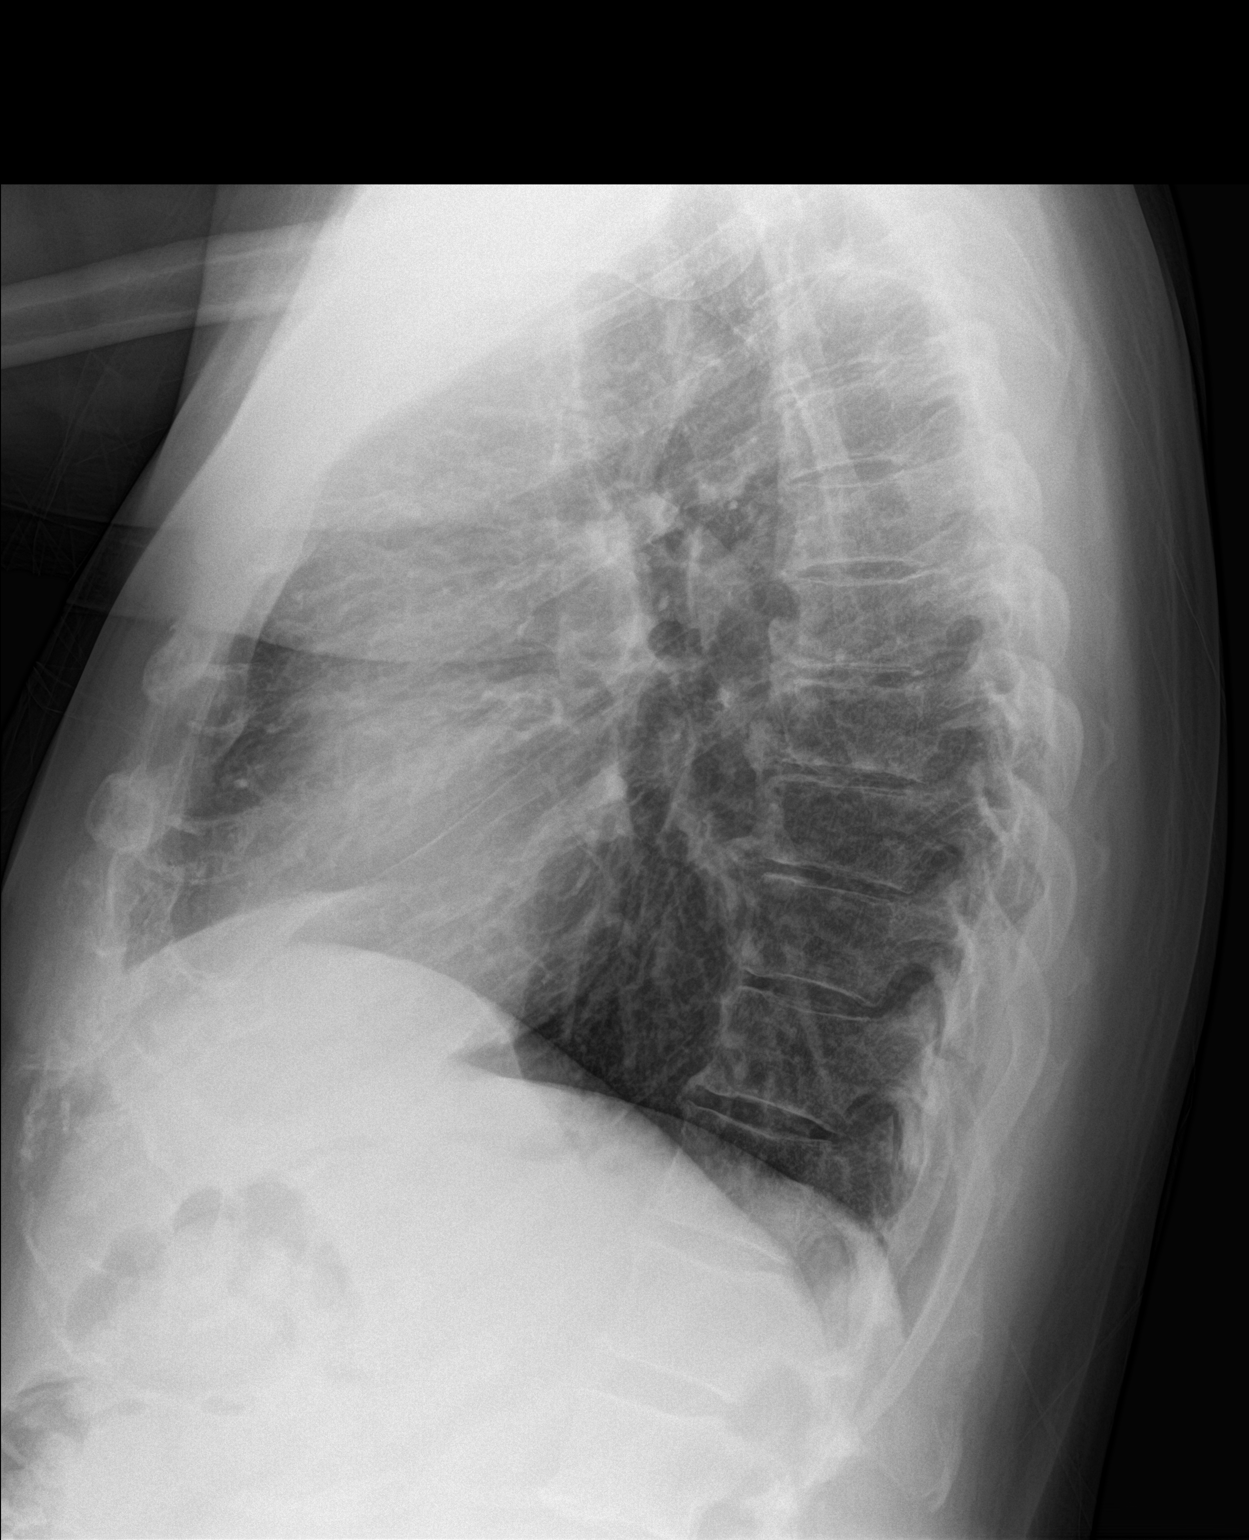

[2 of 2 positions shown; findings below may reference images not displayed]

FINDINGS: Heart and mediastinal contours are within normal limits. No focal
opacities or effusions. No acute bony abnormality.
IMPRESSION: No active cardiopulmonary disease.

## 2021-04-17 ENCOUNTER — Ambulatory Visit: Payer: PPO

## 2021-05-29 ENCOUNTER — Ambulatory Visit: Payer: PPO
